# Patient Record
Sex: Female | Born: 1980 | Race: Black or African American | Hispanic: No | Marital: Married | Smoking: Former smoker
Health system: Southern US, Community
[De-identification: ages and names within clinical notes are randomized; demographics above are authoritative.]

## PROBLEM LIST (undated history)

## (undated) ENCOUNTER — Inpatient Hospital Stay (HOSPITAL_COMMUNITY): Payer: Self-pay

## (undated) DIAGNOSIS — I10 Essential (primary) hypertension: Secondary | ICD-10-CM

## (undated) DIAGNOSIS — R519 Headache, unspecified: Secondary | ICD-10-CM

## (undated) DIAGNOSIS — O009 Unspecified ectopic pregnancy without intrauterine pregnancy: Secondary | ICD-10-CM

## (undated) HISTORY — PX: TUBAL LIGATION: SHX77

---

## 2008-12-27 ENCOUNTER — Inpatient Hospital Stay (HOSPITAL_COMMUNITY): Admission: AD | Admit: 2008-12-27 | Discharge: 2008-12-27 | Payer: Self-pay | Admitting: Obstetrics & Gynecology

## 2009-01-25 ENCOUNTER — Inpatient Hospital Stay (HOSPITAL_COMMUNITY): Admission: AD | Admit: 2009-01-25 | Discharge: 2009-01-25 | Payer: Self-pay | Admitting: Obstetrics

## 2009-01-25 ENCOUNTER — Ambulatory Visit: Payer: Self-pay | Admitting: Advanced Practice Midwife

## 2009-02-25 ENCOUNTER — Inpatient Hospital Stay (HOSPITAL_COMMUNITY): Admission: AD | Admit: 2009-02-25 | Discharge: 2009-02-27 | Payer: Self-pay | Admitting: Obstetrics & Gynecology

## 2009-04-07 ENCOUNTER — Ambulatory Visit (HOSPITAL_COMMUNITY): Admission: RE | Admit: 2009-04-07 | Discharge: 2009-04-07 | Payer: Self-pay | Admitting: Obstetrics & Gynecology

## 2009-12-03 ENCOUNTER — Emergency Department (HOSPITAL_COMMUNITY): Admission: EM | Admit: 2009-12-03 | Discharge: 2009-12-03 | Payer: Self-pay | Admitting: Family Medicine

## 2010-11-14 LAB — POCT I-STAT, CHEM 8
BUN: 9 mg/dL (ref 6–23)
Creatinine, Ser: 0.8 mg/dL (ref 0.4–1.2)
Potassium: 3.9 mEq/L (ref 3.5–5.1)
Sodium: 137 mEq/L (ref 135–145)

## 2010-11-14 LAB — POCT URINALYSIS DIP (DEVICE)
Bilirubin Urine: NEGATIVE
Glucose, UA: NEGATIVE mg/dL
Ketones, ur: NEGATIVE mg/dL
Specific Gravity, Urine: 1.015 (ref 1.005–1.030)

## 2010-12-01 LAB — CBC
Hemoglobin: 13.5 g/dL (ref 12.0–15.0)
MCHC: 32.3 g/dL (ref 30.0–36.0)
MCV: 83.5 fL (ref 78.0–100.0)
RBC: 5 MIL/uL (ref 3.87–5.11)

## 2010-12-02 LAB — CBC
HCT: 33.7 % — ABNORMAL LOW (ref 36.0–46.0)
HCT: 36.3 % (ref 36.0–46.0)
Hemoglobin: 12.1 g/dL (ref 12.0–15.0)
MCV: 84.3 fL (ref 78.0–100.0)
Platelets: 221 10*3/uL (ref 150–400)
Platelets: 228 10*3/uL (ref 150–400)
RDW: 15.7 % — ABNORMAL HIGH (ref 11.5–15.5)
WBC: 6.9 10*3/uL (ref 4.0–10.5)
WBC: 8.9 10*3/uL (ref 4.0–10.5)

## 2010-12-02 LAB — RH IMMUNE GLOB WKUP(>/=20WKS)(NOT WOMEN'S HOSP)

## 2010-12-04 LAB — RH IMMUNE GLOBULIN WORKUP (NOT WOMEN'S HOSP)
ABO/RH(D): O NEG
Antibody Screen: NEGATIVE

## 2011-01-08 NOTE — Op Note (Signed)
Cindy Wang, Cindy Wang              ACCOUNT NO.:  [id number]   MEDICAL RECORD NO.:  [id number]          PATIENT TYPE:  AMB   LOCATION:  SDC                           FACILITY:  [facility]   PHYSICIAN:  Roseanna Rainbow, M.D.DATE OF BIRTH:  09-25-1980   DATE OF PROCEDURE:  04/07/2009  DATE OF DISCHARGE:  04/07/2009                               OPERATIVE REPORT   PREOPERATIVE DIAGNOSIS:  Multiparity, desires sterilization   POSTOPERATIVE DIAGNOSIS:  Multiparity, desires sterilization.   PROCEDURE:  Laparoscopic bilateral tubal ligation with fulguration.   SURGEON:  Roseanna Rainbow, MD.   ANESTHESIA:  General endotracheal.   ESTIMATED BLOOD LOSS:  Minimal.   COMPLICATIONS:  None.   DESCRIPTION OF PROCEDURE:  The patient was taken to the operating room  with an IV running.  A general anesthetic was administered.  She was  then placed in the dorsal lithotomy position and prepped and draped in  the usual sterile fashion.  A bivalve speculum was then placed.  The  anterior lip of the cervix was then grasped with a single-tooth  tenaculum.  A Hulka manipulator was then advanced into the uterus and  secured to the anterior lip of the cervix as a means to manipulate the  uterus.  The tenaculum and speculum was then removed.  After a time-out  had been completed an infraumbilical skin incision was then made with  the scalpel.  Using the Optiview a 10 mm trocar and sleeve were advanced  into the abdomen under direct visualization.  The abdomen was then  insufflated with CO2 gas.  A survey of the pelvic anatomy was  essentially normal with the uterine fundus appeared somewhat globular.  The right fallopian tube was then identified.  A 2-cm segment of the mid  isthmic portion of the tube was then cauterized.  With each application  of the bipolar device, the ohmmeter was noted to go to zero.  The left  fallopian tube was then manipulated in a similar fashion.  All the  instruments were  then removed from the abdomen.  The fascial incision  was reapproximated with an interrupted suture of 0 Vicryl.  The skin was  closed with Dermabond.  The Hulka manipulator was removed with minimal  bleeding noted from the cervix.  At the close of the procedure, the  instrument and pack counts were said to be correct x2.  The patient was  taken to the PACU awake and in stable condition.      Roseanna Rainbow, M.D.  Electronically Signed     LAJ/MEDQ  D:  04/07/2009  T:  04/08/2009  Job:  [id number]

## 2013-02-12 ENCOUNTER — Encounter (HOSPITAL_COMMUNITY): Payer: Self-pay | Admitting: Emergency Medicine

## 2013-02-12 ENCOUNTER — Emergency Department (HOSPITAL_COMMUNITY)
Admission: EM | Admit: 2013-02-12 | Discharge: 2013-02-12 | Disposition: A | Discharge status: Home / Self Care | Payer: Medicaid Other | Source: Home / Self Care | Attending: Emergency Medicine | Admitting: Emergency Medicine

## 2013-02-12 DIAGNOSIS — J040 Acute laryngitis: Secondary | ICD-10-CM

## 2013-02-12 DIAGNOSIS — J069 Acute upper respiratory infection, unspecified: Secondary | ICD-10-CM

## 2013-02-12 MED ORDER — PREDNISONE 20 MG PO TABS
ORAL_TABLET | ORAL | Status: AC
  Filled 2013-02-12: qty 3

## 2013-02-12 MED ORDER — PREDNISONE 10 MG PO TABS: 50.0000 mg | ORAL_TABLET | Freq: Every day | ORAL | Status: DC

## 2013-02-12 MED ORDER — PREDNISONE 20 MG PO TABS
50.0000 mg | ORAL_TABLET | Freq: Once | ORAL | Status: DC
  Administered 2013-02-12: 50 mg via ORAL

## 2013-02-12 MED ORDER — PREDNISONE 20 MG PO TABS
60.0000 mg | ORAL_TABLET | Freq: Once | ORAL | Status: AC
  Administered 2013-02-12: 60 mg via ORAL

## 2013-02-12 MED ORDER — SALINE NASAL SPRAY 0.65 % NA SOLN: 1.0000 | NASAL | Status: DC | PRN

## 2013-02-12 NOTE — ED Notes (Signed)
Pt c/o cold sxs onset Monday... sxs include: facial pressure, bilateral ear pain, dry cough, loss of voice... Denies: f/v/n/d, SOB, wheezing... Taking OTC cold meds w/no relief... She is alert and oriented w/no signs of acute distress.

## 2013-02-12 NOTE — ED Provider Notes (Signed)
History     CSN: [id number]  Arrival date & time 02/12/13  1733   First MD Initiated Contact with Patient 02/12/13 1811      Chief Complaint  Patient presents with  . URI    (Consider location/radiation/quality/duration/timing/severity/associated sxs/prior treatment) Patient is a 32 y.o. female presenting with URI. The history is provided by the patient.  URI Presenting symptoms: congestion, cough, ear pain, facial pain, fever and sore throat   Presenting symptoms: no rhinorrhea   Severity:  Moderate Onset quality:  Gradual Duration:  4 days Timing:  Constant Progression:  Worsening Chronicity:  New Relieved by:  Nothing Worsened by:  Nothing tried Ineffective treatments: otc advil cold and sinus was mildly transiently effective. Associated symptoms: sinus pain   Associated symptoms: no wheezing     History reviewed. No pertinent past medical history.  Past Surgical History  Procedure Laterality Date  . Tubal ligation Bilateral     History reviewed. No pertinent family history.  History  Substance Use Topics  . Smoking status: Never Smoker   . Smokeless tobacco: Not on file  . Alcohol Use: No    OB History   Grav Para Term Preterm Abortions TAB SAB Ect Mult Living                  Review of Systems  Constitutional: Positive for fever and chills.  HENT: Positive for ear pain, congestion, sore throat, voice change, postnasal drip and sinus pressure. Negative for rhinorrhea.   Respiratory: Positive for cough. Negative for shortness of breath and wheezing.     Allergies  Review of patient's allergies indicates no known allergies.  Home Medications   Current Outpatient Rx  Name  Route  Sig  Dispense  Refill  . predniSONE (DELTASONE) 10 MG tablet   Oral   Take 5 tablets (50 mg total) by mouth daily.   20 tablet   0   . sodium chloride (OCEAN NASAL SPRAY) 0.65 % nasal spray   Nasal   Place 1 spray into the nose as needed for congestion.   30 mL  12     BP 141/97  Pulse 99  Temp(Src) 100 F (37.8 C) (Oral)  Resp 18  SpO2 99%  LMP 02/05/2013  Physical Exam  Constitutional: She appears well-developed and well-nourished. She appears ill. No distress.  HENT:  Right Ear: Tympanic membrane, external ear and ear canal normal.  Left Ear: Tympanic membrane, external ear and ear canal normal.  Nose: Mucosal edema present. Right sinus exhibits no maxillary sinus tenderness and no frontal sinus tenderness. Left sinus exhibits maxillary sinus tenderness and frontal sinus tenderness.  Mouth/Throat: Mucous membranes are normal. Posterior oropharyngeal edema present. No oropharyngeal exudate, posterior oropharyngeal erythema or tonsillar abscesses.  Hoarse voice  Cardiovascular: Normal rate and regular rhythm.   Pulmonary/Chest: Effort normal and breath sounds normal.  Lymphadenopathy:       Head (right side): No submental, no submandibular and no tonsillar adenopathy present.       Head (left side): No submental, no submandibular and no tonsillar adenopathy present.    She has no cervical adenopathy.    ED Course  Procedures (including critical care time)  Labs Reviewed - No data to display No results found.   1. URI (upper respiratory infection)   2. Laryngitis       MDM  Pt to continue using otc cold medicine, also recommended nasal saline spray, and pt given prednisone 60mg  here at [facility].  Rx  prednisone 50mg  daily for 4 more days to help with sore throat/ swelling.         Cathlyn Parsons, NP 02/12/13 2026

## 2013-02-12 NOTE — ED Provider Notes (Signed)
Medical screening examination/treatment/procedure(s) were performed by non-physician practitioner and as supervising physician I was immediately available for consultation/collaboration.  Leslee Home, M.D.  Reuben Likes, MD 02/12/13 2102

## 2013-02-17 ENCOUNTER — Encounter (HOSPITAL_COMMUNITY): Payer: Self-pay | Admitting: Emergency Medicine

## 2013-02-17 ENCOUNTER — Emergency Department (HOSPITAL_COMMUNITY)
Admission: EM | Admit: 2013-02-17 | Discharge: 2013-02-17 | Disposition: A | Discharge status: Home / Self Care | Payer: Medicaid Other | Source: Home / Self Care

## 2013-02-17 DIAGNOSIS — J309 Allergic rhinitis, unspecified: Secondary | ICD-10-CM

## 2013-02-17 DIAGNOSIS — J329 Chronic sinusitis, unspecified: Secondary | ICD-10-CM

## 2013-02-17 MED ORDER — FEXOFENADINE HCL 180 MG PO TABS: 180.0000 mg | ORAL_TABLET | Freq: Every day | ORAL | Status: DC

## 2013-02-17 MED ORDER — CEFUROXIME AXETIL 250 MG PO TABS: 250.0000 mg | ORAL_TABLET | Freq: Two times a day (BID) | ORAL | Status: DC

## 2013-02-17 MED ORDER — HYDROCODONE-ACETAMINOPHEN 5-325 MG PO TABS
1.0000 | ORAL_TABLET | ORAL | Status: DC | PRN
Start: 2013-02-17 — End: 2015-06-12

## 2013-02-17 NOTE — ED Notes (Signed)
Pt was seen here this past Friday for URI....comes in today complaining of pain in L ear w/ facial pain and stiff neck...has sensitivity to light and sound..yellow/green stuff coming out of nose...has some nausea but denies emesis.Marland Kitchenshe is drinking water and gatorade...denies any problems with urination, no chest or back pain.Marland KitchenMarland KitchenTJK student

## 2013-02-17 NOTE — ED Notes (Signed)
ZOX:[id number]<AV> Expected date:02/17/13<BR> Expected time: 5:45 PM<BR> Means of arrival:<BR> Comments:<BR> delousing

## 2013-02-17 NOTE — ED Provider Notes (Signed)
Medical screening examination/treatment/procedure(s) were performed by non-physician practitioner and as supervising physician I was immediately available for consultation/collaboration.   MORENO-COLL,Winter Jocelyn; MD  Markell Schrier Moreno-Coll, MD 02/17/13 1953 

## 2013-02-17 NOTE — ED Provider Notes (Signed)
History    CSN: [id number] Arrival date & time 02/17/13  1645  First MD Initiated Contact with Patient 02/17/13 1758     No chief complaint on file.  (Consider location/radiation/quality/duration/timing/severity/associated sxs/prior Treatment) HPI Comments: 32 year old female is complaining of pain in the left face and ear and left lateral neck. She was seen in the urgent care Department 5 days ago for similar symptoms which also included drainage facial pressure fever, chest pain, and nausea without vomiting. She was treated with prednisone, fluticasone and several other symptomatic medications. She states anyway she is feeling better but her primary concern is that of persistent facial pain and nasal congestion with left earache. She is completed her course of prednisone.  No past medical history on file. Past Surgical History  Procedure Laterality Date  . Tubal ligation Bilateral    No family history on file. History  Substance Use Topics  . Smoking status: Never Smoker   . Smokeless tobacco: Not on file  . Alcohol Use: No   OB History   Grav Para Term Preterm Abortions TAB SAB Ect Mult Living                 Review of Systems  Constitutional: Negative for fever, diaphoresis and activity change.  HENT: Positive for ear pain, congestion, rhinorrhea, neck pain and postnasal drip. Negative for sore throat and ear discharge.   Eyes: Negative.   Respiratory: Positive for cough. Negative for shortness of breath and wheezing.   Cardiovascular: Negative for chest pain, palpitations and leg swelling.  Gastrointestinal: Negative.   Genitourinary: Negative.   Musculoskeletal:       Pain in the lateral neck over the trapezius muscle.    Allergies  Review of patient's allergies indicates no known allergies.  Home Medications   Current Outpatient Rx  Name  Route  Sig  Dispense  Refill  . predniSONE (DELTASONE) 10 MG tablet   Oral   Take 5 tablets (50 mg total) by mouth  daily.   20 tablet   0   . sodium chloride (OCEAN NASAL SPRAY) 0.65 % nasal spray   Nasal   Place 1 spray into the nose as needed for congestion.   30 mL   12    LMP 02/05/2013 Physical Exam  Nursing note and vitals reviewed. Constitutional: She is oriented to person, place, and time. She appears well-developed and well-nourished. No distress.  HENT:  Bilateral TMs are normal. Oropharynx with minor erythema but no exudates.  Eyes: Conjunctivae and EOM are normal. Pupils are equal, round, and reactive to light.  Neck: Normal range of motion. Neck supple.  Tenderness over the left lateral trapezius as it inserts on the neck.  Cardiovascular: Normal rate and regular rhythm.   Pulmonary/Chest: Effort normal and breath sounds normal. No respiratory distress.  Musculoskeletal: Normal range of motion. She exhibits no edema.  Lymphadenopathy:    She has no cervical adenopathy.  Neurological: She is alert and oriented to person, place, and time.  Skin: Skin is warm and dry. No rash noted.  Psychiatric: She has a normal mood and affect.    ED Course  Procedures (including critical care time) Labs Reviewed - No data to display No results found. No diagnosis found.  MDM  Ceftin 250 mg twice a day for 8 days Norco 5 mg every 4 hours when necessary pain #15 Continue your other treatment such as the fluticasone nasal spray, and Netty pott, Allegra one 80 mg daily, phenylephrine and  guaifenesin. Drink plenty of fluids stay well hydrated Followup your primary care doctor as needed. Worse the symptoms or problems may return.  Hayden Rasmussen, NP 02/17/13 [phone]

## 2013-05-10 ENCOUNTER — Encounter: Payer: Self-pay | Admitting: *Deleted

## 2013-06-16 ENCOUNTER — Encounter: Payer: Medicaid Other | Admitting: Obstetrics & Gynecology

## 2014-09-26 ENCOUNTER — Encounter (HOSPITAL_COMMUNITY): Payer: Self-pay

## 2014-09-26 ENCOUNTER — Emergency Department (INDEPENDENT_AMBULATORY_CARE_PROVIDER_SITE_OTHER)
Admission: EM | Admit: 2014-09-26 | Discharge: 2014-09-26 | Disposition: A | Discharge status: Home / Self Care | Payer: Self-pay | Source: Home / Self Care | Attending: Family Medicine | Admitting: Family Medicine

## 2014-09-26 DIAGNOSIS — K529 Noninfective gastroenteritis and colitis, unspecified: Secondary | ICD-10-CM

## 2014-09-26 LAB — POCT URINALYSIS DIP (DEVICE)
BILIRUBIN URINE: NEGATIVE
Glucose, UA: NEGATIVE mg/dL
KETONES UR: NEGATIVE mg/dL
Nitrite: NEGATIVE
PROTEIN: 100 mg/dL — AB
Specific Gravity, Urine: 1.02 (ref 1.005–1.030)
UROBILINOGEN UA: 0.2 mg/dL (ref 0.0–1.0)
pH: 6 (ref 5.0–8.0)

## 2014-09-26 LAB — POCT PREGNANCY, URINE: PREG TEST UR: NEGATIVE

## 2014-09-26 MED ORDER — ONDANSETRON 4 MG PO TBDP
ORAL_TABLET | ORAL | Status: AC
  Filled 2014-09-26: qty 1

## 2014-09-26 MED ORDER — ONDANSETRON 4 MG PO TBDP: 4.0000 mg | ORAL_TABLET | Freq: Once | ORAL | Status: DC

## 2014-09-26 MED ORDER — ONDANSETRON 4 MG PO TBDP
4.0000 mg | ORAL_TABLET | Freq: Once | ORAL | Status: AC
  Administered 2014-09-26: 4 mg via ORAL

## 2014-09-26 MED ORDER — ONDANSETRON HCL 4 MG PO TABS: 4.0000 mg | ORAL_TABLET | Freq: Four times a day (QID) | ORAL | Status: DC

## 2014-09-26 NOTE — ED Notes (Signed)
Patient complains of lower back pain that started last night Patient states she was vomiting last evening and running a temperature Pain is more so on her right side Decreased appetite

## 2014-09-26 NOTE — ED Provider Notes (Signed)
CSN: [id number]     Arrival date & time 09/26/14  1817 History   First MD Initiated Contact with Patient 09/26/14 1932     Chief Complaint  Patient presents with  . Back Pain   (Consider location/radiation/quality/duration/timing/severity/associated sxs/prior Treatment) Patient is a 34 y.o. female presenting with vomiting. The history is provided by the patient.  Emesis Severity:  Moderate Duration:  1 day Quality:  Stomach contents and bilious material Progression:  Resolved Chronicity:  New (onset yest 7pm , resolved this am, assoc diarrhea.) Recent urination:  Normal Associated symptoms: chills, diarrhea and fever   Associated symptoms: no abdominal pain     History reviewed. No pertinent past medical history. Past Surgical History  Procedure Laterality Date  . Tubal ligation Bilateral    No family history on file. History  Substance Use Topics  . Smoking status: Never Smoker   . Smokeless tobacco: Not on file  . Alcohol Use: No   OB History    No data available     Review of Systems  Constitutional: Positive for fever, chills and appetite change.  HENT: Negative.   Respiratory: Negative.   Gastrointestinal: Positive for nausea, vomiting and diarrhea. Negative for abdominal pain.  Genitourinary: Negative.   Musculoskeletal: Positive for back pain.    Allergies  Review of patient's allergies indicates no known allergies.  Home Medications   Prior to Admission medications   Medication Sig Start Date End Date Taking? Authorizing Provider  cefUROXime (CEFTIN) 250 MG tablet Take 1 tablet (250 mg total) by mouth 2 (two) times daily. 02/17/13   Hayden Rasmussenavid Mabe, NP  fexofenadine (ALLEGRA) 180 MG tablet Take 1 tablet (180 mg total) by mouth daily. 02/17/13   Hayden Rasmussenavid Mabe, NP  HYDROcodone-acetaminophen (NORCO/VICODIN) 5-325 MG per tablet Take 1 tablet by mouth every 4 (four) hours as needed for pain. 02/17/13   Hayden Rasmussenavid Mabe, NP  ondansetron (ZOFRAN) 4 MG tablet Take 1 tablet (4 mg  total) by mouth every 6 (six) hours. Prn n/v 09/26/14   Linna HoffJames D Candy Ziegler, MD  predniSONE (DELTASONE) 10 MG tablet Take 5 tablets (50 mg total) by mouth daily. 02/12/13   Cathlyn ParsonsAngela M Kabbe, NP  sodium chloride (OCEAN NASAL SPRAY) 0.65 % nasal spray Place 1 spray into the nose as needed for congestion. 02/12/13   Cathlyn ParsonsAngela M Kabbe, NP   BP 146/98 mmHg  Pulse 80  Temp(Src) 99.1 F (37.3 C) (Oral)  Resp 16  SpO2 98%  LMP 09/19/2014 Physical Exam  Constitutional: She appears well-developed and well-nourished. No distress.  HENT:  Head: Normocephalic.  Mouth/Throat: Oropharynx is clear and moist.  Neck: Normal range of motion. Neck supple.  Abdominal: Soft. Normal appearance and bowel sounds are normal. She exhibits no distension and no mass. There is no hepatosplenomegaly. There is tenderness. There is CVA tenderness. There is no rigidity, no rebound and no guarding.  Lymphadenopathy:    She has no cervical adenopathy.  Skin: Skin is warm and dry.  Nursing note and vitals reviewed.   ED Course  Procedures (including critical care time) Labs Review Labs Reviewed  POCT URINALYSIS DIP (DEVICE) - Abnormal; Notable for the following:    Hgb urine dipstick MODERATE (*)    Protein, ur 100 (*)    Leukocytes, UA TRACE (*)    All other components within normal limits  POCT PREGNANCY, URINE   U/a bld mod upreg neg Imaging Review No results found.   MDM   1. Gastroenteritis, acute  Linna Hoff, MD 09/26/14 [phone]

## 2014-09-26 NOTE — Discharge Instructions (Signed)
Clear liquid , bland diet tonight as tolerated, advance on tues as improved, use medicine as needed, imodium for diarrhea, return or see your doctor if any problems. °

## 2015-06-12 ENCOUNTER — Ambulatory Visit (INDEPENDENT_AMBULATORY_CARE_PROVIDER_SITE_OTHER): Payer: PRIVATE HEALTH INSURANCE | Admitting: Obstetrics

## 2015-06-12 ENCOUNTER — Encounter: Payer: Self-pay | Admitting: Obstetrics

## 2015-06-12 VITALS — BP 141/100 | HR 81 | Temp 98.5°F | Ht 61.0 in | Wt 171.0 lb

## 2015-06-12 DIAGNOSIS — Z3201 Encounter for pregnancy test, result positive: Secondary | ICD-10-CM

## 2015-06-12 DIAGNOSIS — O3680X0 Pregnancy with inconclusive fetal viability, not applicable or unspecified: Secondary | ICD-10-CM

## 2015-06-12 DIAGNOSIS — N926 Irregular menstruation, unspecified: Secondary | ICD-10-CM | POA: Diagnosis not present

## 2015-06-12 LAB — POCT URINE PREGNANCY: PREG TEST UR: POSITIVE — AB

## 2015-06-13 ENCOUNTER — Encounter: Payer: Self-pay | Admitting: Obstetrics

## 2015-06-13 LAB — HCG, QUANTITATIVE, PREGNANCY: hCG, Beta Chain, Quant, S: 1878.9 m[IU]/mL

## 2015-06-13 NOTE — Progress Notes (Signed)
Patient ID: Cindy Wang, female   DOB: [birth year], 34 y.o.   MRN: [id number]  Chief Complaint  Patient presents with  . Confirmation of Pregnancy    Patient has a BTL in 2010    HPI Cindy Wang is a 34 y.o. female.  Had Laparoscopic tubal in 2010.  Missed period this past month, then had positive UPT.  HPI  History reviewed. No pertinent past medical history.  Past Surgical History  Procedure Laterality Date  . Tubal ligation Bilateral     History reviewed. No pertinent family history.  Social History Social History  Substance Use Topics  . Smoking status: Former Games developermoker  . Smokeless tobacco: None  . Alcohol Use: No    No Known Allergies  No current outpatient prescriptions on file.   No current facility-administered medications for this visit.    Review of Systems Review of Systems Constitutional: negative for fatigue and weight loss Respiratory: negative for cough and wheezing Cardiovascular: negative for chest pain, fatigue and palpitations Gastrointestinal: negative for abdominal pain and change in bowel habits Genitourinary:negative Integument/breast: negative for nipple discharge Musculoskeletal:negative for myalgias Neurological: negative for gait problems and tremors Behavioral/Psych: negative for abusive relationship, depression Endocrine: negative for temperature intolerance     Blood pressure 141/100, pulse 81, temperature 98.5 F (36.9 C), height 5\' 1"  (1.549 m), weight 171 lb (77.565 kg), last menstrual period 05/03/2015.  Physical Exam Physical Exam: Deferred  100% of 10 min visit spent on counseling and coordination of care.   Data Reviewed UPT  Assessment     Early pregnancy S/P tubal steriilization     Plan    Quantitative beta Hcg drawn, repeat in 2 days. Ectopic precautions given F/U in 2 days.  Plan ultrasound when Quant is in critical range.  Orders Placed This Encounter  Procedures  . hCG, quantitative, pregnancy   . B-HCG Quant  . POCT urine pregnancy   No orders of the defined types were placed in this encounter.

## 2015-06-14 ENCOUNTER — Inpatient Hospital Stay (HOSPITAL_COMMUNITY): Payer: PRIVATE HEALTH INSURANCE

## 2015-06-14 ENCOUNTER — Other Ambulatory Visit: Payer: PRIVATE HEALTH INSURANCE | Admitting: Obstetrics

## 2015-06-14 ENCOUNTER — Other Ambulatory Visit: Payer: Self-pay | Admitting: Obstetrics

## 2015-06-14 ENCOUNTER — Other Ambulatory Visit: Payer: PRIVATE HEALTH INSURANCE

## 2015-06-14 ENCOUNTER — Encounter (HOSPITAL_COMMUNITY): Payer: Self-pay | Admitting: *Deleted

## 2015-06-14 ENCOUNTER — Inpatient Hospital Stay (HOSPITAL_COMMUNITY)
Admission: AD | Admit: 2015-06-14 | Discharge: 2015-06-14 | Disposition: A | Discharge status: Home / Self Care | Payer: PRIVATE HEALTH INSURANCE | Source: Ambulatory Visit | Attending: Obstetrics | Admitting: Obstetrics

## 2015-06-14 DIAGNOSIS — Z3490 Encounter for supervision of normal pregnancy, unspecified, unspecified trimester: Secondary | ICD-10-CM

## 2015-06-14 DIAGNOSIS — O001 Tubal pregnancy without intrauterine pregnancy: Secondary | ICD-10-CM | POA: Diagnosis not present

## 2015-06-14 DIAGNOSIS — Z87891 Personal history of nicotine dependence: Secondary | ICD-10-CM | POA: Diagnosis not present

## 2015-06-14 DIAGNOSIS — Z9851 Tubal ligation status: Secondary | ICD-10-CM | POA: Diagnosis not present

## 2015-06-14 DIAGNOSIS — O009 Unspecified ectopic pregnancy without intrauterine pregnancy: Secondary | ICD-10-CM

## 2015-06-14 DIAGNOSIS — R109 Unspecified abdominal pain: Secondary | ICD-10-CM

## 2015-06-14 DIAGNOSIS — R1032 Left lower quadrant pain: Secondary | ICD-10-CM | POA: Insufficient documentation

## 2015-06-14 DIAGNOSIS — O26899 Other specified pregnancy related conditions, unspecified trimester: Secondary | ICD-10-CM

## 2015-06-14 LAB — CBC WITH DIFFERENTIAL/PLATELET
BASOS ABS: 0 10*3/uL (ref 0.0–0.1)
BASOS PCT: 0 %
Eosinophils Absolute: 0.1 10*3/uL (ref 0.0–0.7)
Eosinophils Relative: 2 %
HEMATOCRIT: 37.3 % (ref 36.0–46.0)
Hemoglobin: 12 g/dL (ref 12.0–15.0)
Lymphocytes Relative: 39 %
Lymphs Abs: 3.5 10*3/uL (ref 0.7–4.0)
MCH: 26.7 pg (ref 26.0–34.0)
MCHC: 32.2 g/dL (ref 30.0–36.0)
MCV: 83.1 fL (ref 78.0–100.0)
MONO ABS: 0.9 10*3/uL (ref 0.1–1.0)
Monocytes Relative: 10 %
NEUTROS ABS: 4.5 10*3/uL (ref 1.7–7.7)
Neutrophils Relative %: 49 %
PLATELETS: 263 10*3/uL (ref 150–400)
RBC: 4.49 MIL/uL (ref 3.87–5.11)
RDW: 14.2 % (ref 11.5–15.5)
WBC: 9.1 10*3/uL (ref 4.0–10.5)

## 2015-06-14 LAB — CREATININE, SERUM
CREATININE: 0.75 mg/dL (ref 0.44–1.00)
GFR calc Af Amer: >60 mL/min (ref >60)
GFR calc non Af Amer: >60 mL/min (ref >60)

## 2015-06-14 LAB — HCG, QUANTITATIVE, PREGNANCY: HCG, BETA CHAIN, QUANT, S: 1791.6 m[IU]/mL — AB

## 2015-06-14 LAB — BUN: BUN: 9 mg/dL (ref 6–20)

## 2015-06-14 LAB — AST: AST: 13 U/L — ABNORMAL LOW (ref 15–41)

## 2015-06-14 MED ORDER — METHOTREXATE INJECTION FOR WOMEN'S HOSPITAL
50.0000 mg/m2 | Freq: Once | INTRAMUSCULAR | Status: AC
  Administered 2015-06-14: 90 mg via INTRAMUSCULAR
  Filled 2015-06-14: qty 1.8

## 2015-06-14 MED ORDER — RHO D IMMUNE GLOBULIN 1500 UNIT/2ML IJ SOSY
300.0000 ug | PREFILLED_SYRINGE | Freq: Once | INTRAMUSCULAR | Status: AC
  Administered 2015-06-14: 300 ug via INTRAMUSCULAR
  Filled 2015-06-14: qty 2

## 2015-06-14 NOTE — MAU Note (Signed)
Pt called, not in lobby 

## 2015-06-14 NOTE — MAU Provider Note (Signed)
History     CSN: [id number]  Arrival date and time: 06/14/15 1503   First Provider Initiated Contact with Patient 06/14/15 1626      Chief Complaint  Patient presents with  . Abdominal Pain   HPI This is a 34 y.o. female who presents from Dr Verdell Carmine office for ultrasound to rule out ectopic pregnancy.  HCG levels dropped. She had a tubal ligation in 2012 and they suspect this is an ectopic pregnancy. Pain has been on LLQ area.   RN Note: Pt seen in MD office today, BHCG levels are dropping. Pt had BTL in 2010, ? Ectopic pregnancy. LLQ pain for the last 3-4 days, no bleeding  OB History    Gravida Para Term Preterm AB TAB SAB Ectopic Multiple Living   History reviewed. No pertinent past medical history.  Past Surgical History  Procedure Laterality Date  . Tubal ligation Bilateral     History reviewed. No pertinent family history.  Social History  Substance Use Topics  . Smoking status: Former Games developer  . Smokeless tobacco: None  . Alcohol Use: No    Allergies: No Known Allergies  No prescriptions prior to admission   Medical, Surgical, Family and Social histories reviewed and are listed above.  Medications and allergies reviewed.  Review of Systems  Constitutional: Negative for fever, chills and malaise/fatigue.  Gastrointestinal: Positive for abdominal pain. Negative for nausea, vomiting, diarrhea and constipation.  Genitourinary: Negative for dysuria.  Musculoskeletal: Negative for back pain.  Neurological: Negative for dizziness and weakness.   Physical Exam   Blood pressure 136/99, pulse 85, temperature 98.1 F (36.7 C), temperature source Oral, resp. rate 18, height  (1.549 m), weight 173 lb 6.4 oz (78.654 kg), last menstrual period 05/03/2015.  Physical Exam  Constitutional: She is oriented to person, place, and time. She appears well-developed and well-nourished. No distress.  HENT:  Head: Normocephalic.  Cardiovascular:  Normal rate, regular rhythm and normal heart sounds.   Respiratory: Effort normal and breath sounds normal. No respiratory distress.  GI: Soft. She exhibits no distension. There is no tenderness. There is no rebound and no guarding.  Genitourinary:  Done in office, deferred  Musculoskeletal: Normal range of motion.  Neurological: She is alert and oriented to person, place, and time.  Skin: Skin is warm and dry.  Psychiatric: She has a normal mood and affect.    MAU Course  Procedures  MDM US Ob Comp Less 14 Wks  06/14/2015  CLINICAL DATA:  Left lower quadrant pain for 4 days. Inappropriately rising quantitative beta HCG. EXAM: OBSTETRIC <14 WK [place] AND TRANSVAGINAL OB US TECHNIQUE: Both transabdominal and transvaginal ultrasound examinations were performed for complete evaluation of the gestation as well as the maternal uterus, adnexal regions, and pelvic cul-de-sac. Transvaginal technique was performed to assess early pregnancy. COMPARISON:  None. FINDINGS: Intrauterine gestational sac: None Yolk sac:  None Embryo:  None Cardiac Activity: Not applicable Maternal uterus/adnexae: Subchorionic hemorrhage: Not applicable Right ovary: Normal Left ovary: Normal Other :Within the right adnexa there is a complex mass structure measuring 1.3 x 1.2 x 1.1 cm. This has a central fluid component which appears to have a small yolk sac. Suspicious for ectopic. Free fluid:  None. IMPRESSION: 1. No evidence for intrauterine gestation. 2. Complex cystic structure in the right adnexa is suspicious for ectopic pregnancy. Critical Value/emergent results were called by telephone at the time of interpretation  on 06/14/2015 at 4:16 pm to Dr. Wynelle BourgeoisMARIE WILLIAMS , who verbally acknowledged these results. Electronically Signed   By: Signa Kellaylor  Stroud M.D.   On: 06/14/2015 16:16   [place] Ob Transvaginal  06/14/2015  CLINICAL DATA:  Left lower quadrant pain for 4 days. Inappropriately rising quantitative beta HCG. EXAM: OBSTETRIC <14  WK US AND TRANSVAGINAL OB US TECHNIQUE: Both transabdominal and transvaginal ultrasound examinations were performed for complete evaluation of the gestation as well as the maternal uterus, adnexal regions, and pelvic cul-de-sac. Transvaginal technique was performed to assess early pregnancy. COMPARISON:  None. FINDINGS: Intrauterine gestational sac: None Yolk sac:  None Embryo:  None Cardiac Activity: Not applicable Maternal uterus/adnexae: Subchorionic hemorrhage: Not applicable Right ovary: Normal Left ovary: Normal Other :Within the right adnexa there is a complex mass structure measuring 1.3 x 1.2 x 1.1 cm. This has a central fluid component which appears to have a small yolk sac. Suspicious for ectopic. Free fluid:  None. IMPRESSION: 1. No evidence for intrauterine gestation. 2. Complex cystic structure in the right adnexa is suspicious for ectopic pregnancy. Critical Value/emergent results were called by telephone at the time of interpretation on 06/14/2015 at 4:16 pm to Dr. Wynelle BourgeoisMARIE WILLIAMS , who verbally acknowledged these results. Electronically Signed   By: Signa Kellaylor  Stroud M.D.   On: 06/14/2015 16:16   Results for orders placed or performed during the hospital encounter of 06/14/15 (from the past 24 hour(s))  CBC WITH DIFFERENTIAL     Status: None   Collection Time: 06/14/15  4:35 PM  Result Value Ref Range   WBC 9.1 4.0 - 10.5 K/uL   RBC 4.49 3.87 - 5.11 MIL/uL   Hemoglobin 12.0 12.0 - 15.0 g/dL   HCT 29.537.3 62.136.0 - 30.846.0 %   MCV 83.1 78.0 - 100.0 fL   MCH 26.7 26.0 - 34.0 pg   MCHC 32.2 30.0 - 36.0 g/dL   RDW 65.714.2 84.611.5 - 96.215.5 %   Platelets 263 150 - 400 K/uL   Neutrophils Relative % 49 %   Neutro Abs 4.5 1.7 - 7.7 K/uL   Lymphocytes Relative 39 %   Lymphs Abs 3.5 0.7 - 4.0 K/uL   Monocytes Relative 10 %   Monocytes Absolute 0.9 0.1 - 1.0 K/uL   Eosinophils Relative 2 %   Eosinophils Absolute 0.1 0.0 - 0.7 K/uL   Basophils Relative 0 %   Basophils Absolute 0.0 0.0 - 0.1 K/uL  AST      Status: Abnormal   Collection Time: 06/14/15  4:35 PM  Result Value Ref Range   AST 13 (L) 15 - 41 U/L  BUN     Status: None   Collection Time: 06/14/15  4:35 PM  Result Value Ref Range   BUN 9 6 - 20 mg/dL  Creatinine, serum     Status: None   Collection Time: 06/14/15  4:35 PM  Result Value Ref Range   Creatinine, Ser 0.75 0.44 - 1.00 mg/dL   GFR calc non Af Amer >60 >60 mL/min   GFR calc Af Amer >60 >60 mL/min  Rh IG workup (includes ABO/Rh)     Status: None (Preliminary result)   Collection Time: 06/14/15  4:35 PM  Result Value Ref Range   Gestational Age(Wks) 6    ABO/RH(D) O NEG    Antibody Screen NEG    Unit Number [id number]    Blood Component Type RHIG    Unit division 00    Status of Unit ISSUED  Transfusion Status OK TO TRANSFUSE      Assessment and Plan  A:  Ectopic pregnancy, right  P:  Consulted Dr Clearance Coots with presentation, exam findings and results.  Received orders for Methotrexate       Labs ordered pre-injection       Methotrexate given       Ectopic precautions       Return for Day # 4 labs                       [facility] 06/14/2015, 4:44 PM

## 2015-06-14 NOTE — Discharge Instructions (Signed)
°Methotrexate Treatment for an Ectopic Pregnancy °Methotrexate is a medicine that treats ectopic pregnancy by stopping the growth of the fertilized egg. It also helps your body absorb tissue from the egg. This takes between 2 weeks and 6 weeks. Most ectopic pregnancies can be successfully treated with methotrexate if they are detected early enough. °LET YOUR HEALTH CARE PROVIDER KNOW ABOUT: °· Any allergies you have. °· All medicines you are taking, including vitamins, herbs, eye drops, creams, and over-the-counter medicines. °· Medical conditions you have. °RISKS AND COMPLICATIONS °Generally, this is a safe treatment. However, as with any treatment, problems can occur. Possible problems or side effects include: °· Nausea. °· Vomiting. °· Diarrhea. °· Abdominal cramping. °· Mouth sores. °· Increased vaginal bleeding or spotting.   °· Swelling or irritation of the lining of your lungs (pneumonitis).  °· Failed treatment and continuation of the pregnancy.   °· Liver damage. °· Hair loss. °There is still a risk of the ectopic pregnancy rupturing while using the methotrexate. °BEFORE THE PROCEDURE °Before you take the medicine:  °· Liver tests, kidney tests, and a complete blood test are performed. °· Blood tests are performed to measure the pregnancy hormone levels and to determine your blood type. °· If you are Rh-negative and the father is Rh-positive or his Rh type is not known, you will be given a Rho (D) immune globulin shot. °PROCEDURE  °There are two methods that your health care provider may use to prescribe methotrexate. One method involves a single dose or injection of the medicine. Another method involves a series of doses given through several injections.  °AFTER THE PROCEDURE °· You may have some abdominal cramping, vaginal bleeding, and fatigue in the first few days after taking methotrexate. °· Blood tests will be taken for several weeks to check the pregnancy hormone levels. The blood tests are  performed until there is no more pregnancy hormone detected in the blood. °  °This information is not intended to replace advice given to you by your health care provider. Make sure you discuss any questions you have with your health care provider. °  °Document Released: 08/06/2001 Document Revised: 09/02/2014 Document Reviewed: 05/31/2013 °Elsevier Interactive Patient Education ©2016 [employer] ° °Methotrexate Treatment for an Ectopic Pregnancy, Care After °Refer to this sheet in the next few weeks. These instructions provide you with information on caring for yourself after your procedure. Your health care provider may also give you more specific instructions. Your treatment has been planned according to current medical practices, but problems sometimes occur. Call your health care provider if you have any problems or questions after your procedure. °WHAT TO EXPECT AFTER THE PROCEDURE °You may have some abdominal cramping, vaginal bleeding, and fatigue in the first few days after taking methotrexate. Some other possible side effects of methotrexate include: °· Nausea. °· Vomiting. °· Diarrhea. °· Mouth sores. °· Swelling or irritation of the lining of your lungs (pneumonitis). °· Liver damage. °· Hair loss. °HOME CARE INSTRUCTIONS  °After you have received the methotrexate medicine, you need to be careful of your activities and watch your condition for several weeks. It may take 1 week before your hormone levels return to normal. °· Keep all follow-up appointments as directed by your health care provider. °· Avoid traveling too far away from your health care provider. °· Do not have sexual intercourse until your health care provider says it is safe to do so. °· You may resume your usual diet. °· Limit strenuous activity. °· Do not take folic   acid, prenatal vitamins, or other vitamins that contain folic acid. °· Do not take aspirin, ibuprofen, or naproxen (nonsteroidal anti-inflammatory drugs [NSAIDs]). °· Do  not drink alcohol. °SEEK MEDICAL CARE IF:  °· You cannot control your nausea and vomiting. °· You cannot control your diarrhea. °· You have sores in your mouth and want treatment. °· You need pain medicine for your abdominal pain. °· You have a rash. °· You are having a reaction to the medicine. °SEEK IMMEDIATE MEDICAL CARE IF:  °· You have increasing abdominal or pelvic pain. °· You notice increased bleeding. °· You feel light-headed, or you faint. °· You have shortness of breath. °· Your heart rate increases. °· You have a cough. °· You have chills. °· You have a fever. °  °This information is not intended to replace advice given to you by your health care provider. Make sure you discuss any questions you have with your health care provider. °  °Document Released: 08/01/2011 Document Revised: 08/17/2013 Document Reviewed: 05/31/2013 °Elsevier Interactive Patient Education ©2016 Elsevier Inc. ° °

## 2015-06-14 NOTE — MAU Note (Signed)
Pt seen in MD office today, BHCG levels are dropping.  Pt had BTL in 2010, ? Ectopic pregnancy.  LLQ pain for the last 3-4 days, no bleeding.

## 2015-06-15 ENCOUNTER — Ambulatory Visit (HOSPITAL_COMMUNITY): Payer: PRIVATE HEALTH INSURANCE

## 2015-06-15 LAB — RH IG WORKUP (INCLUDES ABO/RH)
ABO/RH(D): O NEG
Antibody Screen: NEGATIVE
Gestational Age(Wks): 6
UNIT DIVISION: 0

## 2015-06-17 ENCOUNTER — Inpatient Hospital Stay (HOSPITAL_COMMUNITY)
Admission: AD | Admit: 2015-06-17 | Discharge: 2015-06-17 | Disposition: A | Discharge status: Home / Self Care | Payer: PRIVATE HEALTH INSURANCE | Source: Ambulatory Visit | Attending: Obstetrics | Admitting: Obstetrics

## 2015-06-17 DIAGNOSIS — O009 Unspecified ectopic pregnancy without intrauterine pregnancy: Secondary | ICD-10-CM | POA: Insufficient documentation

## 2015-06-17 DIAGNOSIS — N3000 Acute cystitis without hematuria: Secondary | ICD-10-CM | POA: Diagnosis not present

## 2015-06-17 DIAGNOSIS — O001 Tubal pregnancy without intrauterine pregnancy: Secondary | ICD-10-CM | POA: Diagnosis not present

## 2015-06-17 LAB — HCG, QUANTITATIVE, PREGNANCY: HCG, BETA CHAIN, QUANT, S: 842 m[IU]/mL — AB (ref <5)

## 2015-06-17 MED ORDER — SULFAMETHOXAZOLE-TRIMETHOPRIM 800-160 MG PO TABS: 1.0000 | ORAL_TABLET | Freq: Two times a day (BID) | ORAL | Status: DC

## 2015-06-17 MED ORDER — PHENAZOPYRIDINE HCL 200 MG PO TABS: 200.0000 mg | ORAL_TABLET | Freq: Three times a day (TID) | ORAL | Status: DC

## 2015-06-17 NOTE — MAU Provider Note (Signed)
Chief Complaint  Patient presents with  . Follow-up    Subjective:   Pt is a 34 y.o. [id number] here for follow-up BHCG.  Upon review of the records patient was first seen on 06-14-15 for positive pregnancy test after having BTL.   BHCG on that day was 1791.  Ultrasound showed no IUGS, no yolk sac, no embryo, mass in right adenexa suspicious for ectopic pregnancy. GC/CT and wet prep were not collected.   Pt was given methotrexate for ectopic pregnancy and discharged home 06-14-15.   Pt here today with no report of abdominal pain or vaginal bleeding.   All other systems negative.  Last seen in MAU on 06-14-15.      BHCG was 1891.    No past medical history on file.  OB History  Gravida Para Term Preterm AB SAB TAB Ectopic Multiple Living  4 3 3       3     # Outcome Date GA Lbr Len/2nd Weight Sex Delivery Anes PTL Lv  4 Current           3 Term 02/25/09    Genella MechM Vag-Spont EPI  Y  2 Term 12/20/05    F Vag-Spont   Y  1 Term 03/27/99    Genella MechM Vag-Spont   Y      No family history on file.  Objective: Physical Exam  There were no vitals filed for this visit. Constitutional: She is oriented to person, place, and time. She appears well-developed and well-nourished. No distress.  Pulmonary/Chest: Effort normal. No respiratory distress.  Musculoskeletal: Normal range of motion.  Neurological: She is alert and oriented to person, place, and time.  Skin: Skin is warm and dry.   Results for orders placed or performed during the hospital encounter of 06/17/15 (from the past 24 hour(s))  hCG, quantitative, pregnancy     Status: Abnormal   Collection Time: 06/17/15  9:10 AM  Result Value Ref Range   hCG, Beta Chain, Quant, S 842 (H) <5 mIU/mL     Assessment: 34 y.o. [id number] with significant drop of BHCG on day 4 following methotrexate  Plan: Return on Tuesday for Day 7 BHCG. Return sooner if develops pain or severe vaginal bleeding. Advised continued pelvic rest - no sex, no tampons, no  douching.

## 2015-06-17 NOTE — MAU Note (Signed)
Pt here for hcg check, 4 days post MTX.  No complaints

## 2015-06-18 ENCOUNTER — Inpatient Hospital Stay (HOSPITAL_COMMUNITY)
Admission: AD | Admit: 2015-06-18 | Discharge: 2015-06-19 | Disposition: A | Discharge status: Home / Self Care | Payer: PRIVATE HEALTH INSURANCE | Source: Ambulatory Visit | Attending: Obstetrics | Admitting: Obstetrics

## 2015-06-18 ENCOUNTER — Inpatient Hospital Stay (HOSPITAL_COMMUNITY): Payer: PRIVATE HEALTH INSURANCE

## 2015-06-18 DIAGNOSIS — Z87891 Personal history of nicotine dependence: Secondary | ICD-10-CM | POA: Insufficient documentation

## 2015-06-18 DIAGNOSIS — R109 Unspecified abdominal pain: Secondary | ICD-10-CM

## 2015-06-18 DIAGNOSIS — R030 Elevated blood-pressure reading, without diagnosis of hypertension: Secondary | ICD-10-CM | POA: Insufficient documentation

## 2015-06-18 DIAGNOSIS — O26899 Other specified pregnancy related conditions, unspecified trimester: Secondary | ICD-10-CM

## 2015-06-18 DIAGNOSIS — O008 Other ectopic pregnancy without intrauterine pregnancy: Secondary | ICD-10-CM | POA: Insufficient documentation

## 2015-06-18 DIAGNOSIS — O009 Unspecified ectopic pregnancy without intrauterine pregnancy: Secondary | ICD-10-CM

## 2015-06-18 DIAGNOSIS — O001 Tubal pregnancy without intrauterine pregnancy: Secondary | ICD-10-CM

## 2015-06-18 DIAGNOSIS — R110 Nausea: Secondary | ICD-10-CM | POA: Insufficient documentation

## 2015-06-18 DIAGNOSIS — N8312 Corpus luteum cyst of left ovary: Secondary | ICD-10-CM | POA: Insufficient documentation

## 2015-06-18 LAB — CBC
HEMATOCRIT: 35 % — AB (ref 36.0–46.0)
HEMOGLOBIN: 11.2 g/dL — AB (ref 12.0–15.0)
MCH: 27.1 pg (ref 26.0–34.0)
MCHC: 32 g/dL (ref 30.0–36.0)
MCV: 84.7 fL (ref 78.0–100.0)
Platelets: 254 10*3/uL (ref 150–400)
RBC: 4.13 MIL/uL (ref 3.87–5.11)
RDW: 14.1 % (ref 11.5–15.5)
WBC: 9.3 10*3/uL (ref 4.0–10.5)

## 2015-06-18 LAB — HCG, QUANTITATIVE, PREGNANCY: hCG, Beta Chain, Quant, S: 296 m[IU]/mL — ABNORMAL HIGH (ref <5)

## 2015-06-18 MED ORDER — KETOROLAC TROMETHAMINE 60 MG/2ML IM SOLN
60.0000 mg | Freq: Once | INTRAMUSCULAR | Status: AC
  Administered 2015-06-19: 60 mg via INTRAMUSCULAR
  Filled 2015-06-18: qty 2

## 2015-06-18 NOTE — MAU Note (Signed)
Pt states she has ectopic pregnancy and received MTX Tuesday, having LLQ pain worse than she had previously and some vaginal spotting that started an hour ago. Took tylenol at 6pm; did not work.

## 2015-06-18 NOTE — MAU Provider Note (Signed)
CSN: [id number]     Arrival date & time 06/18/15  2123 History   None    No chief complaint on file.    (Consider location/radiation/quality/duration/timing/severity/associated sxs/prior Treatment) HPI Pt is [redacted]w[redacted]d known ectopic seen here from his office on 06/14/2015.  She was initally seen in Dr. Verdell Carmine office on 10/17with HCG 1878  Then 1791 on 10/19 with MTX  Day 4 842  On 10/22 (yesterday) She has been taking Tylenol 2 tablets with control of pain but today pain has progressively gotten worse esp on left side and more intense around 4 pm/  Pt started spotting today about 6 pm Pt is now nauseated- no dizziness Pt last ate at 4 pm baked chicken, green beans mashed potatoes  No past medical history on file. Past Surgical History  Procedure Laterality Date  . Tubal ligation Bilateral    No family history on file. Social History  Substance Use Topics  . Smoking status: Former Games developer  . Smokeless tobacco: Not on file  . Alcohol Use: No   OB History    Gravida Para Term Preterm AB TAB SAB Ectopic Multiple Living   Results for Cindy, Wang (MRN [id number]) as of 06/18/2015 23:06  Ref. Range 06/14/2015 16:35 06/17/2015 09:10 06/18/2015 21:35 06/18/2015 22:44  HCG, Beta Chain, Quant, S Latest Ref Range: <5 mIU/mL  842 (H) 296 (H)    Review of Systems  Gastrointestinal: Positive for abdominal pain.  Genitourinary: Positive for pelvic pain. Negative for dysuria.  Neurological: Negative for dizziness.      Allergies  Review of patient's allergies indicates no known allergies.  Home Medications   Prior to Admission medications   Not on File   BP 136/99 mmHg  Pulse 68  Temp(Src) 98.6 F (37 C) (Oral)  Resp 18  Ht  (1.549 m)  Wt 177 lb 9.6 oz (80.559 kg)  BMI 33.57 kg/m2  SpO2 99%  LMP 05/03/2015 Physical Exam  Constitutional: She is oriented to person, place, and time. She appears well-developed and well-nourished. No distress.  HENT:   Head: Normocephalic.  Eyes: Pupils are equal, round, and reactive to light.  Neck: Normal range of motion. Neck supple.  Cardiovascular: Normal rate.   Pulmonary/Chest: Effort normal.  Abdominal: Soft. She exhibits no distension. There is tenderness. There is guarding. There is no rebound.  Musculoskeletal: Normal range of motion.  Neurological: She is alert and oriented to person, place, and time.  Skin: Skin is warm and dry.  Psychiatric: She has a normal mood and affect.  Nursing note and vitals reviewed.   ED Course  Procedures (including critical care time) Labs Review Labs Reviewed  CBC - Abnormal; Notable for the following:    Hemoglobin 11.2 (*)    HCT 35.0 (*)    All other components within normal limits  HCG, QUANTITATIVE, PREGNANCY   Results for orders placed or performed during the hospital encounter of 06/18/15 (from the past 24 hour(s))  CBC     Status: Abnormal   Collection Time: 06/18/15  9:35 PM  Result Value Ref Range   WBC 9.3 4.0 - 10.5 K/uL   RBC 4.13 3.87 - 5.11 MIL/uL   Hemoglobin 11.2 (L) 12.0 - 15.0 g/dL   HCT 78.2 (L) 95.6 - 21.3 %   MCV 84.7 78.0 - 100.0 fL   MCH 27.1 26.0 - 34.0 pg   MCHC 32.0 30.0 - 36.0 g/dL  RDW 14.1 11.5 - 15.5 %   Platelets 254 150 - 400 K/uL  hCG, quantitative, pregnancy     Status: Abnormal   Collection Time: 06/18/15  9:35 PM  Result Value Ref Range   hCG, Beta Chain, Quant, S 296 (H) <5 mIU/mL    Imaging Review No results found. I have personally reviewed and evaluated these images and lab results as part of my medical decision-making. [facility] Transvaginal  06/18/2015  CLINICAL DATA:  34 year old female with known right-sided ectopic pregnancy. Followup study. EXAM: TRANSVAGINAL OB ULTRASOUND TECHNIQUE: Transvaginal ultrasound was performed for complete evaluation of the gestation as well as the maternal uterus, adnexal regions, and pelvic cul-de-sac. COMPARISON:  Pelvic ultrasound 06/14/2015. FINDINGS: Intrauterine  gestational sac: None. Yolk sac:  None. Embryo:  None. Cardiac Activity: None. Heart Rate: N/A Maternal uterus/adnexae: Previously noted complex cystic lesion in the right adnexa is not appreciated on today's examination. Bilateral ovaries are normal in appearance. Probable corpus luteum cyst in the left ovary. Trace volume of free fluid in the cul-de-sac. IMPRESSION: 1. Resolution of previously noted complex cystic lesion in the right adnexa, suggestive of a resolved ectopic. Electronically Signed   By: Trudie Reedaniel  Entrikin M.D.   On: 06/18/2015 22:51    EKG Interpretation None      MDM   Final diagnoses:  Abdominal pain in pregnancy   toradol 60mg  IM given for pain Discussed with Dr. Clearance CootsHarper Imp-abdominal pain/Right  ectopic pregnancy- resolving Left corpus luteum ovarian cyst F/u for day 7 HCG as previously scheduled  Cindy HoitSusan Lineberry, NP  [phone] Notified by Cindy Angathy RN that blood pressures elevated 147/109; Upon review of records found an elevated blood pressure at an office visit.  Dr. Clearance CootsHarper notified > begin Coreg 6.25 and HCTZ 12.5 mg and follow-up in office as scheduled; follow-up with PCP for BP maintenance.    Cindy Wang, CNM

## 2015-06-19 ENCOUNTER — Encounter (HOSPITAL_COMMUNITY): Payer: Self-pay | Admitting: Family

## 2015-06-19 MED ORDER — CARVEDILOL 6.25 MG PO TABS: 6.2500 mg | ORAL_TABLET | Freq: Two times a day (BID) | ORAL | Status: DC

## 2015-06-19 MED ORDER — HYDROCHLOROTHIAZIDE 12.5 MG PO CAPS: 12.5000 mg | ORAL_CAPSULE | Freq: Every day | ORAL | Status: DC

## 2015-06-20 ENCOUNTER — Inpatient Hospital Stay (HOSPITAL_COMMUNITY)
Admission: AD | Admit: 2015-06-20 | Discharge: 2015-06-20 | Disposition: A | Discharge status: Home / Self Care | Payer: PRIVATE HEALTH INSURANCE | Source: Ambulatory Visit | Attending: Obstetrics | Admitting: Obstetrics

## 2015-06-20 DIAGNOSIS — O009 Unspecified ectopic pregnancy without intrauterine pregnancy: Secondary | ICD-10-CM | POA: Insufficient documentation

## 2015-06-20 LAB — HCG, QUANTITATIVE, PREGNANCY: hCG, Beta Chain, Quant, S: 112 m[IU]/mL — ABNORMAL HIGH (ref <5)

## 2015-06-20 MED ORDER — OXYCODONE-ACETAMINOPHEN 5-325 MG PO TABS: 2.0000 | ORAL_TABLET | ORAL | Status: DC | PRN

## 2015-06-20 NOTE — MAU Note (Signed)
Pt not in lobby, told front desk, that she would return in an hour (after blood was drawn)

## 2015-06-20 NOTE — MAU Note (Signed)
PT  HAS RETURNED  FOR  7 TH DAY   BLOOD  DRAW.  SHE  RECEIVED  METH OTREXATE  ON 10-19.     WAS HERE Sunday NIGHT -  PAIN WAS 8-9 - THINKING  FROM A CYST.       TODAY PAIN IS  AN 8-  LOWER  CRAMPING.  SHE  TOOK TYLENOL AT 5PM   2 TABS OF 500 MG.-    NO RELIEF.          HER BLEEDING  ON Sunday  WAS  SPOTTING.     BUT BLOOD  STARTED  FLOWING  AT 0520  THIS  MORN- SOAKED PANTYLINER.      NOW  BLOOD  LESS RED-, PASSED  BLOOD  CLOTS   2  GOLF BALL SIZE .    IN TRIAGE - PAD-    SMALL  AMT  LIGHT  RED.

## 2015-06-27 ENCOUNTER — Inpatient Hospital Stay (HOSPITAL_COMMUNITY)
Admission: AD | Admit: 2015-06-27 | Discharge: 2015-06-27 | Disposition: A | Discharge status: Home / Self Care | Payer: Medicaid Other | Source: Ambulatory Visit | Attending: Obstetrics | Admitting: Obstetrics

## 2015-06-27 ENCOUNTER — Encounter (HOSPITAL_COMMUNITY): Payer: Self-pay | Admitting: *Deleted

## 2015-06-27 DIAGNOSIS — O009 Unspecified ectopic pregnancy without intrauterine pregnancy: Secondary | ICD-10-CM | POA: Diagnosis present

## 2015-06-27 DIAGNOSIS — Z87891 Personal history of nicotine dependence: Secondary | ICD-10-CM | POA: Diagnosis not present

## 2015-06-27 LAB — HCG, QUANTITATIVE, PREGNANCY: hCG, Beta Chain, Quant, S: 9 m[IU]/mL — ABNORMAL HIGH (ref <5)

## 2015-06-27 NOTE — Discharge Instructions (Signed)
Methotrexate Treatment for an Ectopic Pregnancy, Care After Refer to this sheet in the next few weeks. These instructions provide you with information on caring for yourself after your procedure. Your health care provider may also give you more specific instructions. Your treatment has been planned according to current medical practices, but problems sometimes occur. Call your health care provider if you have any problems or questions after your procedure. WHAT TO EXPECT AFTER THE PROCEDURE You may have some abdominal cramping, vaginal bleeding, and fatigue in the first few days after taking methotrexate. Some other possible side effects of methotrexate include:  Nausea.  Vomiting.  Diarrhea.  Mouth sores.  Swelling or irritation of the lining of your lungs (pneumonitis).  Liver damage.  Hair loss. HOME CARE INSTRUCTIONS  After you have received the methotrexate medicine, you need to be careful of your activities and watch your condition for several weeks. It may take 1 week before your hormone levels return to normal.  Keep all follow-up appointments as directed by your health care provider.  Avoid traveling too far away from your health care provider.  Do not have sexual intercourse until your health care provider says it is safe to do so.  You may resume your usual diet.  Limit strenuous activity.  Do not take folic acid, prenatal vitamins, or other vitamins that contain folic acid.  Do not take aspirin, ibuprofen, or naproxen (nonsteroidal anti-inflammatory drugs [NSAIDs]).  Do not drink alcohol. SEEK MEDICAL CARE IF:   You cannot control your nausea and vomiting.  You cannot control your diarrhea.  You have sores in your mouth and want treatment.  You need pain medicine for your abdominal pain.  You have a rash.  You are having a reaction to the medicine. SEEK IMMEDIATE MEDICAL CARE IF:   You have increasing abdominal or pelvic pain.  You notice increased  bleeding.  You feel light-headed, or you faint.  You have shortness of breath.  Your heart rate increases.  You have a cough.  You have chills.  You have a fever.   This information is not intended to replace advice given to you by your health care provider. Make sure you discuss any questions you have with your health care provider.   Document Released: 08/01/2011 Document Revised: 08/17/2013 Document Reviewed: 05/31/2013 Elsevier Interactive Patient Education 2016 [employer].   Hypertension Hypertension, commonly called high blood pressure, is when the force of blood pumping through your arteries is too strong. Your arteries are the blood vessels that carry blood from your heart throughout your body. A blood pressure reading consists of a higher number over a lower number, such as 110/72. The higher number (systolic) is the pressure inside your arteries when your heart pumps. The lower number (diastolic) is the pressure inside your arteries when your heart relaxes. Ideally you want your blood pressure below 120/80. Hypertension forces your heart to work harder to pump blood. Your arteries may become narrow or stiff. Having untreated or uncontrolled hypertension can cause heart attack, stroke, kidney disease, and other problems. RISK FACTORS Some risk factors for high blood pressure are controllable. Others are not.  Risk factors you cannot control include:   Race. You may be at higher risk if you are African American.  Age. Risk increases with age.  Gender. Men are at higher risk than women before age 64 years. After age 30, women are at higher risk than men. Risk factors you can control include:  Not getting enough exercise or  physical activity.  Being overweight.  Getting too much fat, sugar, calories, or salt in your diet.  Drinking too much alcohol. SIGNS AND SYMPTOMS Hypertension does not usually cause signs or symptoms. Extremely high blood pressure (hypertensive  crisis) may cause headache, anxiety, shortness of breath, and nosebleed. DIAGNOSIS To check if you have hypertension, your health care provider will measure your blood pressure while you are seated, with your arm held at the level of your heart. It should be measured at least twice using the same arm. Certain conditions can cause a difference in blood pressure between your right and left arms. A blood pressure reading that is higher than normal on one occasion does not mean that you need treatment. If it is not clear whether you have high blood pressure, you may be asked to return on a different day to have your blood pressure checked again. Or, you may be asked to monitor your blood pressure at home for 1 or more weeks. TREATMENT Treating high blood pressure includes making lifestyle changes and possibly taking medicine. Living a healthy lifestyle can help lower high blood pressure. You may need to change some of your habits. Lifestyle changes may include:  Following the DASH diet. This diet is high in fruits, vegetables, and whole grains. It is low in salt, red meat, and added sugars.  Keep your sodium intake below 2,300 mg per day.  Getting at least 30-45 minutes of aerobic exercise at least 4 times per week.  Losing weight if necessary.  Not smoking.  Limiting alcoholic beverages.  Learning ways to reduce stress. Your health care provider may prescribe medicine if lifestyle changes are not enough to get your blood pressure under control, and if one of the following is true:  You are 8018-35 years of age and your systolic blood pressure is above 140.  You are 35 years of age or older, and your systolic blood pressure is above 150.  Your diastolic blood pressure is above 90.  You have diabetes, and your systolic blood pressure is over 140 or your diastolic blood pressure is over 90.  You have kidney disease and your blood pressure is above 140/90.  You have heart disease and your  blood pressure is above 140/90. Your personal target blood pressure may vary depending on your medical conditions, your age, and other factors. HOME CARE INSTRUCTIONS  Have your blood pressure rechecked as directed by your health care provider.   Take medicines only as directed by your health care provider. Follow the directions carefully. Blood pressure medicines must be taken as prescribed. The medicine does not work as well when you skip doses. Skipping doses also puts you at risk for problems.  Do not smoke.   Monitor your blood pressure at home as directed by your health care provider. SEEK MEDICAL CARE IF:   You think you are having a reaction to medicines taken.  You have recurrent headaches or feel dizzy.  You have swelling in your ankles.  You have trouble with your vision. SEEK IMMEDIATE MEDICAL CARE IF:  You develop a severe headache or confusion.  You have unusual weakness, numbness, or feel faint.  You have severe chest or abdominal pain.  You vomit repeatedly.  You have trouble breathing. MAKE SURE YOU:   Understand these instructions.  Will watch your condition.  Will get help right away if you are not doing well or get worse.   This information is not intended to replace advice given to you  by your health care provider. Make sure you discuss any questions you have with your health care provider.   Document Released: 08/12/2005 Document Revised: 12/27/2014 Document Reviewed: 06/04/2013 Elsevier Interactive Patient Education [employer]

## 2015-06-27 NOTE — MAU Note (Signed)
PT  SAYS  SHE WAS HERE  LAST  Tuesday  FOR  LABS-   TO RETURN  TODAY  FOR  LABS   .  LAST  Tuesday   HER PAIN  WAS AN 8  --    AND  NOW   NO PAIN.      LAST    Tuesday   HER  BLEEDING-      WAS SMALL AMT-     NOW    NO BLEEDING  - NO SPOTTING.

## 2015-06-27 NOTE — MAU Provider Note (Signed)
History   [id number]   Chief Complaint  Patient presents with  . Follow-up  . Ectopic Pregnancy    HPI Cindy Wang is a 34 y.o. female [phone] here for follow-up BHCG.  Upon review of the records patient was first seen on 10/19 for abdominal pain.   BHCG on that day was 1791.  Ultrasound showed right adnexal mass suspicious for ectopic pregnancy with no IUP. Pt discharged home.Pt here today with no report of abdominal pain or vaginal bleeding.   All other systems negative.  Last seen in MAU on 10/25. BHCG was 112.    Patient's last menstrual period was 05/03/2015.  OB History  Gravida Para Term Preterm AB SAB TAB Ectopic Multiple Living  4 3 3       3     # Outcome Date GA Lbr Len/2nd Weight Sex Delivery Anes PTL Lv  4 Current           3 Term 02/25/09    Genella MechM Vag-Spont EPI  Y  2 Term 12/20/05    F Vag-Spont   Y  1 Term 03/27/99    Genella MechM Vag-Spont   Y      No past medical history on file.  No family history on file.  Social History   Social History  . Marital Status: Married    Spouse Name: N/A  . Number of Children: N/A  . Years of Education: N/A   Social History Main Topics  . Smoking status: Former Games developermoker  . Smokeless tobacco: None  . Alcohol Use: No  . Drug Use: No  . Sexual Activity:    Partners: Male    Birth Control/ Protection: Surgical   Other Topics Concern  . None   Social History Narrative    No Known Allergies  No current facility-administered medications on file prior to encounter.   Current Outpatient Prescriptions on File Prior to Encounter  Medication Sig Dispense Refill  . acetaminophen (TYLENOL) 500 MG tablet Take 1,000 mg by mouth every 6 (six) hours as needed for mild pain or headache.    . carvedilol (COREG) 6.25 MG tablet Take 1 tablet (6.25 mg total) by mouth 2 (two) times daily with a meal. 60 tablet 2  . hydrochlorothiazide (MICROZIDE) 12.5 MG capsule Take 1 capsule (12.5 mg total) by mouth daily. 60 capsule 1  .  oxyCODONE-acetaminophen (PERCOCET/ROXICET) 5-325 MG tablet Take 2 tablets by mouth every 4 (four) hours as needed for severe pain. 15 tablet 0     Physical Exam   Filed Vitals:   06/27/15 2114  BP: 137/99  Pulse: 111  Temp: 98.6 F (37 C)  TempSrc: Oral  Resp: 20  Height: 5\' 1"  (1.549 m)  Weight: 172 lb 2 oz (78.075 kg)    Physical Exam  Nursing note and vitals reviewed. Constitutional: She is oriented to person, place, and time. She appears well-developed and well-nourished. No distress.  HENT:  Head: Normocephalic and atraumatic.  Eyes: Conjunctivae are normal. Right eye exhibits no discharge. Left eye exhibits no discharge. No scleral icterus.  Neck: Normal range of motion.  Cardiovascular:  No murmur heard. Respiratory: Effort normal. No respiratory distress.  Neurological: She is alert and oriented to person, place, and time.  Skin: Skin is warm and dry. She is not diaphoretic.  Psychiatric: She has a normal mood and affect. Her behavior is normal. Judgment and thought content normal.    MAU Course  Procedures Component     Latest Ref Rng 06/12/2015  06/14/2015 06/17/2015 06/18/2015  HCG, Beta Chain, Quant, S     <5 mIU/mL 1878.9 1791.6 (H) 842 (H) 296 (H)   Component     Latest Ref Rng 06/20/2015 06/27/2015  HCG, Beta Chain, Quant, S     <5 mIU/mL 112 (H) 9 (H)   MDM O negative - rhophylac given on 10/19 2114- Discussed BHCG result with Dr. Clearance Coots. Ok to discharge home. F/u in office in 1 week.   Assessment and Plan  34 y.o. G4P3003 at [redacted]w[redacted]d wks Pregnancy Follow-up BHCG Pregnancy of Unknown Location 1. Ectopic pregnancy     P: Discharge home Schedule f/u with [facility] in 1 week.  Continue taking antihypertensive medication Discussed reasons to return to ED (chest pain, severe headache, stroke symptoms, SOB, severe htn).  Judeth Horn, NP 06/27/2015 9:12 PM

## 2015-07-03 ENCOUNTER — Other Ambulatory Visit: Payer: Self-pay | Admitting: *Deleted

## 2015-07-03 MED ORDER — CARVEDILOL 6.25 MG PO TABS: 6.2500 mg | ORAL_TABLET | Freq: Two times a day (BID) | ORAL | Status: DC

## 2015-07-03 MED ORDER — HYDROCHLOROTHIAZIDE 12.5 MG PO CAPS: 12.5000 mg | ORAL_CAPSULE | Freq: Every day | ORAL | Status: DC

## 2015-07-04 ENCOUNTER — Encounter: Payer: Self-pay | Admitting: Obstetrics

## 2015-07-04 ENCOUNTER — Ambulatory Visit (INDEPENDENT_AMBULATORY_CARE_PROVIDER_SITE_OTHER): Payer: PRIVATE HEALTH INSURANCE | Admitting: Obstetrics

## 2015-07-04 VITALS — BP 132/94 | HR 101 | Temp 98.5°F | Wt 173.0 lb

## 2015-07-04 DIAGNOSIS — O00109 Unspecified tubal pregnancy without intrauterine pregnancy: Secondary | ICD-10-CM

## 2015-07-04 DIAGNOSIS — Z30011 Encounter for initial prescription of contraceptive pills: Secondary | ICD-10-CM | POA: Diagnosis not present

## 2015-07-04 DIAGNOSIS — I10 Essential (primary) hypertension: Secondary | ICD-10-CM | POA: Diagnosis not present

## 2015-07-04 MED ORDER — LEVONORGESTREL-ETHINYL ESTRAD 0.15-30 MG-MCG PO TABS: 1.0000 | ORAL_TABLET | Freq: Every day | ORAL | Status: DC

## 2015-07-04 NOTE — Progress Notes (Signed)
Patient ID: Cindy Wang, female   DOB: 10/15/1980, 34 y.o.   MRN: [id number]  Chief Complaint  Patient presents with  . Advice Only    HPI Cindy Wang is a 34 y.o. female.  H/O ectopic pregnancy after failed Laparoscopic tubal.  Received MTX and now following quantitative beta HCG's.  Patient has no complaints of pain or bleeding today.  Quant. = 9 on 06-27-15.  Presents today for F/U Quant.   HPI  History reviewed. No pertinent past medical history.  Past Surgical History  Procedure Laterality Date  . Tubal ligation Bilateral     History reviewed. No pertinent family history.  Social History Social History  Substance Use Topics  . Smoking status: Former Games developer  . Smokeless tobacco: None  . Alcohol Use: No    No Known Allergies  Current Outpatient Prescriptions  Medication Sig Dispense Refill  . acetaminophen (TYLENOL) 500 MG tablet Take 1,000 mg by mouth every 6 (six) hours as needed for mild pain or headache.    . carvedilol (COREG) 6.25 MG tablet Take 1 tablet (6.25 mg total) by mouth 2 (two) times daily with a meal. 60 tablet 2  . hydrochlorothiazide (MICROZIDE) 12.5 MG capsule Take 1 capsule (12.5 mg total) by mouth daily. 60 capsule 1  . levonorgestrel-ethinyl estradiol (NORDETTE) 0.15-30 MG-MCG tablet Take 1 tablet by mouth daily. 1 Package 11  . oxyCODONE-acetaminophen (PERCOCET/ROXICET) 5-325 MG tablet Take 2 tablets by mouth every 4 (four) hours as needed for severe pain. (Patient not taking: Reported on 07/04/2015) 15 tablet 0   No current facility-administered medications for this visit.    Review of Systems Review of Systems Constitutional: negative for fatigue and weight loss Respiratory: negative for cough and wheezing Cardiovascular: negative for chest pain, fatigue and palpitations Gastrointestinal: negative for abdominal pain and change in bowel habits Genitourinary:negative Integument/breast: negative for nipple  discharge Musculoskeletal:negative for myalgias Neurological: negative for gait problems and tremors Behavioral/Psych: negative for abusive relationship, depression Endocrine: negative for temperature intolerance     Blood pressure 132/94, pulse 101, temperature 98.5 F (36.9 C), weight 173 lb (78.472 kg), last menstrual period 05/03/2015, unknown if currently breastfeeding.  Physical Exam:   Physical Exam: Deferred                                                                                                                                                                                  100% of 10 min visit spent on counseling and coordination of care.   Data Reviewed Labs  Assessment     Tubal ectopic pregnancy.  Resolving with MTX.  Contraceptive counseling and advice.  Interested in the pill for now. Hypertension  Plan    Quantitative beta HCG today                                                                                                                                                                                 Coreg / Dyazide started.  Stable.  Referred to Internal Medicine. Patient wants referral to Reproductive Endocrinology for consultation for surgical treatment of failed tubal and ART for another pregnancy.  Orders Placed This Encounter  Procedures  . B-HCG Quant  . Ambulatory referral to Endocrinology    Referral Priority:  Routine    Referral Type:  Consultation    Referral Reason:  Specialty Services Required    Number of Visits Requested:  1  . Ambulatory referral to Internal Medicine    Referral Priority:  Routine    Referral Type:  Consultation    Referral Reason:  Specialty Services Required    Requested Specialty:  Internal Medicine    Number of Visits Requested:  1   Meds ordered this encounter  Medications  . levonorgestrel-ethinyl estradiol (NORDETTE) 0.15-30 MG-MCG tablet    Sig: Take 1 tablet by mouth daily.    Dispense:  1 Package     Refill:  11

## 2015-07-05 ENCOUNTER — Telehealth: Payer: Self-pay

## 2015-07-05 LAB — HCG, QUANTITATIVE, PREGNANCY: hCG, Beta Chain, Quant, S: <2 m[IU]/mL

## 2015-07-05 NOTE — Telephone Encounter (Signed)
Patient has been referred to PCP for hypertension and has appt on 07/17/15 at 9:30am - called to let her know - seeing if either Dr. Lyndal RainbowYalcinkaya's office or Dr. Regan RakersMonica Doerr's office will see her for endocrinology - her insurance an issue for infertility

## 2015-07-12 ENCOUNTER — Telehealth: Payer: Self-pay

## 2015-07-12 NOTE — Telephone Encounter (Signed)
DR. Oneita KrasERR'S OFFICE COULD NOT SEE PATIENT, DR Lyndal RainbowYALCINKAYA'S OFFICE NOT IN NETWORK WITH PATIENT'S INSURANCE AND WOULD HAVE TO BE SELF-PAY, PATIENT SAID WOULD CONTACT THEM WHEN SHE CAN DO IT

## 2015-09-22 ENCOUNTER — Other Ambulatory Visit: Payer: Self-pay | Admitting: Obstetrics

## 2015-10-04 ENCOUNTER — Emergency Department (HOSPITAL_COMMUNITY)
Admission: EM | Admit: 2015-10-04 | Discharge: 2015-10-04 | Disposition: A | Discharge status: Home / Self Care | Payer: Medicaid Other | Attending: Emergency Medicine | Admitting: Emergency Medicine

## 2015-10-04 ENCOUNTER — Emergency Department (HOSPITAL_COMMUNITY): Payer: Medicaid Other

## 2015-10-04 ENCOUNTER — Encounter (HOSPITAL_COMMUNITY): Payer: Self-pay | Admitting: Emergency Medicine

## 2015-10-04 DIAGNOSIS — S63602A Unspecified sprain of left thumb, initial encounter: Secondary | ICD-10-CM | POA: Diagnosis not present

## 2015-10-04 DIAGNOSIS — Z87891 Personal history of nicotine dependence: Secondary | ICD-10-CM | POA: Diagnosis not present

## 2015-10-04 DIAGNOSIS — W01198A Fall on same level from slipping, tripping and stumbling with subsequent striking against other object, initial encounter: Secondary | ICD-10-CM | POA: Insufficient documentation

## 2015-10-04 DIAGNOSIS — Y998 Other external cause status: Secondary | ICD-10-CM | POA: Diagnosis not present

## 2015-10-04 DIAGNOSIS — S6992XA Unspecified injury of left wrist, hand and finger(s), initial encounter: Secondary | ICD-10-CM | POA: Diagnosis present

## 2015-10-04 DIAGNOSIS — Z79899 Other long term (current) drug therapy: Secondary | ICD-10-CM | POA: Diagnosis not present

## 2015-10-04 DIAGNOSIS — I10 Essential (primary) hypertension: Secondary | ICD-10-CM | POA: Insufficient documentation

## 2015-10-04 DIAGNOSIS — Y9289 Other specified places as the place of occurrence of the external cause: Secondary | ICD-10-CM | POA: Diagnosis not present

## 2015-10-04 DIAGNOSIS — Y9389 Activity, other specified: Secondary | ICD-10-CM | POA: Diagnosis not present

## 2015-10-04 DIAGNOSIS — Z793 Long term (current) use of hormonal contraceptives: Secondary | ICD-10-CM | POA: Insufficient documentation

## 2015-10-04 HISTORY — DX: Unspecified ectopic pregnancy without intrauterine pregnancy: O00.90

## 2015-10-04 HISTORY — DX: Essential (primary) hypertension: I10

## 2015-10-04 MED ORDER — HYDROCODONE-ACETAMINOPHEN 5-325 MG PO TABS: 1.0000 | ORAL_TABLET | Freq: Four times a day (QID) | ORAL | Status: DC | PRN

## 2015-10-04 NOTE — ED Notes (Signed)
Patient states she does have high blood pressure and takes medication for it. She did take it this morning.

## 2015-10-04 NOTE — ED Provider Notes (Signed)
CSN: [id number]     Arrival date & time 10/04/15  0905 History  By signing my name below, I, Soijett Blue, attest that this documentation has been prepared under the direction and in the presence of Roxy Horseman, PA-C Electronically Signed: Soijett Blue, ED Scribe. 10/04/2015. 9:20 AM.     Chief Complaint  Patient presents with  . Hand Injury      The history is provided by the patient. No language interpreter was used.    Keiry A Haggar is a 35 y.o. female with a PMHx of HTN, who presents to the Emergency Department complaining of left hand injury occurring this morning PTA. She notes that she had a mechanical fall onto her left thumb due to tripping over a shoe and her left thumb bent backwards at the time of the fall. She states that she had immediate pain to her left thumb. She reports that she is able to move her left thumb, but there is pain with extension of the thumb. Pt denies pain to any other part of her body. Pt is having associated symptoms of left thumb swelling. She notes that she has not tried any medications for the relief of her symptoms. She denies color change, wound, rash, left wrist pain, and any other symptoms.    Past Medical History  Diagnosis Date  . Hypertension   . Ectopic pregnancy    Past Surgical History  Procedure Laterality Date  . Tubal ligation Bilateral    No family history on file. Social History  Substance Use Topics  . Smoking status: Former Games developer  . Smokeless tobacco: None  . Alcohol Use: No   OB History    Gravida Para Term Preterm AB TAB SAB Ectopic Multiple Living   Review of Systems  Musculoskeletal: Positive for joint swelling and arthralgias.  Skin: Negative for color change, rash and wound.      Allergies  Review of patient's allergies indicates no known allergies.  Home Medications   Prior to Admission medications   Medication Sig Start Date End Date Taking? Authorizing Provider  acetaminophen  (TYLENOL) 500 MG tablet Take 1,000 mg by mouth every 6 (six) hours as needed for mild pain or headache.    Historical Provider, MD  carvedilol (COREG) 6.25 MG tablet Take 1 tablet (6.25 mg total) by mouth 2 (two) times daily with a meal. 07/03/15   Brock Bad, MD  hydrochlorothiazide (MICROZIDE) 12.5 MG capsule Take 1 capsule (12.5 mg total) by mouth daily. 07/03/15   Brock Bad, MD  levonorgestrel-ethinyl estradiol (NORDETTE) 0.15-30 MG-MCG tablet Take 1 tablet by mouth daily. 07/04/15   Brock Bad, MD  oxyCODONE-acetaminophen (PERCOCET/ROXICET) 5-325 MG tablet Take 2 tablets by mouth every 4 (four) hours as needed for severe pain. Patient not taking: Reported on 07/04/2015 06/20/15   Jean Rosenthal, NP   BP 153/107 mmHg  Pulse 84  Temp(Src) 98.3 F (36.8 C) (Oral)  Resp 16  SpO2 99%  LMP 05/03/2015 Physical Exam Physical Exam  Constitutional: Pt appears well-developed and well-nourished. No distress.  HENT:  Head: Normocephalic and atraumatic.  Eyes: Conjunctivae are normal.  Neck: Normal range of motion.  Cardiovascular: Normal rate, regular rhythm and intact distal pulses.   Capillary refill < 3 sec  Pulmonary/Chest: Effort normal and breath sounds normal.  Musculoskeletal: Pt exhibits tenderness over the left thumb at the MCP and thenar eminence. No bony abnormality  or deformity. Left wrist without tenderness or deformity. Nl ROM and strength. Pt exhibits no edema.  ROM: 3/5 limited by pain.  Neurological: Pt  is alert. Coordination normal.  Sensation 5/5 Strength 3/5 limited by pain.  Skin: Skin is warm and dry. Pt is not diaphoretic.  No tenting of the skin  Psychiatric: Pt has a normal mood and affect.  Nursing note and vitals reviewed.  ED Course  Procedures (including critical care time) DIAGNOSTIC STUDIES: Oxygen Saturation is 99% on RA, nl by my interpretation.    COORDINATION OF CARE: 9:18 AM Discussed treatment plan with pt at bedside which  includes left hand xray, splint, referral and follow up with orthopedist, and pt agreed to plan.     Imaging Review Dg Hand Complete Left  10/04/2015  CLINICAL DATA:  complaining of left hand injury occurring this morning PTA. She notes that she had a mechanical fall onto her left thumb due to tripping over a shoe and her left thumb bent backwards at the time of the fall. Mild immobility in thumb. EXAM: LEFT HAND - COMPLETE 3+ VIEW COMPARISON:  None. FINDINGS: No evidence of fracture of the carpal or metacarpal bones. Radiocarpal joint is intact. Phalanges are normal. No soft tissue injury. IMPRESSION: No fracture or dislocation. Electronically Signed   By: Genevive Bi M.D.   On: 10/04/2015 09:53   I have personally reviewed and evaluated these images as part of my medical decision-making.    MDM   Final diagnoses:  Thumb sprain, left, initial encounter    Patient X-Ray negative for obvious fracture or dislocation.  Pt advised to follow up with orthopedics. Patient given splint while in ED, conservative therapy recommended and discussed. Patient will be discharged home & is agreeable with above plan. Returns precautions discussed. Pt appears safe for discharge.  I personally performed the services described in this documentation, which was scribed in my presence. The recorded information has been reviewed and is accurate.      Roxy Horseman, PA-C 10/04/15 1040  Tilden Fossa, MD 10/05/15 0700

## 2015-10-04 NOTE — ED Notes (Signed)
Patient states mechanical fall this morning when she tripped over a shoe.   Patient states landed on her L hand with her thumb having been pushed backwards.   Patient complains of swelling and pain.

## 2015-10-04 NOTE — ED Notes (Signed)
Given ice pack

## 2015-10-04 NOTE — Discharge Instructions (Signed)
Finger Sprain A finger sprain is a tear in one of the strong, fibrous tissues that connect the bones (ligaments) in your finger. The severity of the sprain depends on how much of the ligament is torn. The tear can be either partial or complete. CAUSES  Often, sprains are a result of a fall or accident. If you extend your hands to catch an object or to protect yourself, the force of the impact causes the fibers of your ligament to stretch too much. This excess tension causes the fibers of your ligament to tear. SYMPTOMS  You may have some loss of motion in your finger. Other symptoms include:  Bruising.  Tenderness.  Swelling. DIAGNOSIS  In order to diagnose finger sprain, your caregiver will physically examine your finger or thumb to determine how torn the ligament is. Your caregiver may also suggest an X-ray exam of your finger to make sure no bones are broken. TREATMENT  If your ligament is only partially torn, treatment usually involves keeping the finger in a fixed position (immobilization) for a short period. To do this, your caregiver will apply a bandage, cast, or splint to keep your finger from moving until it heals. For a partially torn ligament, the healing process usually takes 2 to 3 weeks. If your ligament is completely torn, you may need surgery to reconnect the ligament to the bone. After surgery a cast or splint will be applied and will need to stay on your finger or thumb for 4 to 6 weeks while your ligament heals. HOME CARE INSTRUCTIONS  Keep your injured finger elevated, when possible, to decrease swelling.  To ease pain and swelling, apply ice to your joint twice a day, for 2 to 3 days:  Put ice in a plastic bag.  Place a towel between your skin and the bag.  Leave the ice on for 15 minutes.  Only take over-the-counter or prescription medicine for pain as directed by your caregiver.  Do not wear rings on your injured finger.  Do not leave your finger unprotected  until pain and stiffness go away (usually 3 to 4 weeks).  Do not allow your cast or splint to get wet. Cover your cast or splint with a plastic bag when you shower or bathe. Do not swim.  Your caregiver may suggest special exercises for you to do during your recovery to prevent or limit permanent stiffness. SEEK IMMEDIATE MEDICAL CARE IF:  Your cast or splint becomes damaged.  Your pain becomes worse rather than better. MAKE SURE YOU:  Understand these instructions.  Will watch your condition.  Will get help right away if you are not doing well or get worse.   This information is not intended to replace advice given to you by your health care provider. Make sure you discuss any questions you have with your health care provider.   Document Released: 09/19/2004 Document Revised: 09/02/2014 Document Reviewed: 04/15/2011 Elsevier Interactive Patient Education 2016 [employer]  

## 2015-12-25 ENCOUNTER — Other Ambulatory Visit: Payer: Self-pay | Admitting: Obstetrics

## 2015-12-27 NOTE — Telephone Encounter (Signed)
Please review

## 2015-12-28 ENCOUNTER — Other Ambulatory Visit: Payer: Self-pay | Admitting: Obstetrics

## 2016-03-25 ENCOUNTER — Other Ambulatory Visit: Payer: Self-pay | Admitting: Obstetrics

## 2016-03-25 ENCOUNTER — Telehealth: Payer: Self-pay | Admitting: *Deleted

## 2016-03-25 NOTE — Telephone Encounter (Signed)
Patient has no primary care provider and we are the last provider to fill her Carvedilol 6.5 mg. Can we refill and make her a referral to primary care?

## 2016-03-26 ENCOUNTER — Other Ambulatory Visit: Payer: Self-pay | Admitting: Obstetrics

## 2016-03-26 DIAGNOSIS — I10 Essential (primary) hypertension: Secondary | ICD-10-CM

## 2016-03-26 MED ORDER — CARVEDILOL 6.25 MG PO TABS: 6.2500 mg | ORAL_TABLET | Freq: Two times a day (BID) | ORAL | 2 refills | Status: DC

## 2016-03-26 NOTE — Telephone Encounter (Signed)
Coreg Rx Referred to IM for HTN

## 2016-04-02 ENCOUNTER — Emergency Department (HOSPITAL_COMMUNITY)
Admission: EM | Admit: 2016-04-02 | Discharge: 2016-04-02 | Disposition: A | Discharge status: Home / Self Care | Payer: Medicaid Other | Attending: Emergency Medicine | Admitting: Emergency Medicine

## 2016-04-02 ENCOUNTER — Encounter (HOSPITAL_COMMUNITY): Payer: Self-pay | Admitting: Vascular Surgery

## 2016-04-02 DIAGNOSIS — Z87891 Personal history of nicotine dependence: Secondary | ICD-10-CM | POA: Insufficient documentation

## 2016-04-02 DIAGNOSIS — I10 Essential (primary) hypertension: Secondary | ICD-10-CM | POA: Diagnosis not present

## 2016-04-02 DIAGNOSIS — R51 Headache: Secondary | ICD-10-CM | POA: Diagnosis present

## 2016-04-02 DIAGNOSIS — R519 Headache, unspecified: Secondary | ICD-10-CM

## 2016-04-02 LAB — BASIC METABOLIC PANEL
Anion gap: 6 (ref 5–15)
BUN: 7 mg/dL (ref 6–20)
CALCIUM: 9.2 mg/dL (ref 8.9–10.3)
CHLORIDE: 104 mmol/L (ref 101–111)
CO2: 25 mmol/L (ref 22–32)
CREATININE: 0.8 mg/dL (ref 0.44–1.00)
GFR calc non Af Amer: >60 mL/min (ref >60)
GLUCOSE: 87 mg/dL (ref 65–99)
Potassium: 4.4 mmol/L (ref 3.5–5.1)
Sodium: 135 mmol/L (ref 135–145)

## 2016-04-02 LAB — CBC WITH DIFFERENTIAL/PLATELET
BASOS ABS: 0 10*3/uL (ref 0.0–0.1)
BASOS PCT: 0 %
EOS PCT: 2 %
Eosinophils Absolute: 0.1 10*3/uL (ref 0.0–0.7)
HCT: 42.5 % (ref 36.0–46.0)
Hemoglobin: 13.5 g/dL (ref 12.0–15.0)
LYMPHS PCT: 45 %
Lymphs Abs: 3.5 10*3/uL (ref 0.7–4.0)
MCH: 26.9 pg (ref 26.0–34.0)
MCHC: 31.8 g/dL (ref 30.0–36.0)
MCV: 84.7 fL (ref 78.0–100.0)
MONO ABS: 0.7 10*3/uL (ref 0.1–1.0)
Monocytes Relative: 8 %
Neutro Abs: 3.6 10*3/uL (ref 1.7–7.7)
Neutrophils Relative %: 45 %
PLATELETS: 318 10*3/uL (ref 150–400)
RBC: 5.02 MIL/uL (ref 3.87–5.11)
RDW: 13.9 % (ref 11.5–15.5)
WBC: 7.9 10*3/uL (ref 4.0–10.5)

## 2016-04-02 LAB — TSH: TSH: 2.795 u[IU]/mL (ref 0.350–4.500)

## 2016-04-02 LAB — I-STAT BETA HCG BLOOD, ED (MC, WL, AP ONLY)

## 2016-04-02 MED ORDER — DIPHENHYDRAMINE HCL 50 MG/ML IJ SOLN
25.0000 mg | Freq: Once | INTRAMUSCULAR | Status: AC
  Administered 2016-04-02: 25 mg via INTRAVENOUS
  Filled 2016-04-02: qty 1

## 2016-04-02 MED ORDER — SODIUM CHLORIDE 0.9 % IV BOLUS (SEPSIS)
1000.0000 mL | Freq: Once | INTRAVENOUS | Status: AC
  Administered 2016-04-02: 1000 mL via INTRAVENOUS

## 2016-04-02 MED ORDER — DEXAMETHASONE SODIUM PHOSPHATE 10 MG/ML IJ SOLN
10.0000 mg | Freq: Once | INTRAMUSCULAR | Status: AC
  Administered 2016-04-02: 10 mg via INTRAVENOUS
  Filled 2016-04-02: qty 1

## 2016-04-02 MED ORDER — METOCLOPRAMIDE HCL 10 MG PO TABS
10.0000 mg | ORAL_TABLET | Freq: Once | ORAL | Status: AC
  Administered 2016-04-02: 10 mg via ORAL
  Filled 2016-04-02: qty 1

## 2016-04-02 NOTE — Discharge Instructions (Signed)
Our social worker has spoken about establishing care with a primary doctor.  Please follow up with that information.

## 2016-04-02 NOTE — ED Notes (Signed)
Pt understood dc material. NAD noted. 

## 2016-04-02 NOTE — ED Triage Notes (Signed)
Pt reports to the ED for eval of HTN. Pt has hx of same and was placed on BP medications but she does not have a PCP so she has not been able to get her medications adjusted. Pt reports she has had decreased PO intake and HAs x 1 month. Has worse with light. She also has some associated nausea. She is still currently taking her BP medications but she just has not been able to get her medications adjusted. Pt A&Ox4, resp e/u, and skin warm and dry.

## 2016-04-02 NOTE — ED Provider Notes (Signed)
[facility]-EMERGENCY DEPT Provider Note   CSN: [id number] Arrival date & time: 04/02/16  1550  First Provider Contact:  None       History   Chief Complaint Chief Complaint  Patient presents with  . Hypertension    HPI Cindy Wang is a 35 y.o. female.  HPI  Patient presents with complaint of headache and fatigue. She reports she has a history of hypertension, does been on meds for a while. For a couple months she has been feeling generally fatigued, not herself, and had intermittent frontal headaches. She describes increased rigidity of her headaches in the past month. She thinks is related to her blood pressure, however does endorse compliance with her medicines. She denies any falls, fevers, chills. She also endorses some decreased oral intake, due to poor taste of food, and occasional nausea that usually associate with headaches.  Past Medical History:  Diagnosis Date  . Ectopic pregnancy   . Hypertension     There are no active problems to display for this patient.   Past Surgical History:  Procedure Laterality Date  . TUBAL LIGATION Bilateral     OB History    Gravida Para Term Preterm AB Living   4 3 3     3    SAB TAB Ectopic Multiple Live Births           3       Home Medications    Prior to Admission medications   Medication Sig Start Date End Date Taking? Authorizing Provider  acetaminophen (TYLENOL) 500 MG tablet Take 1,000 mg by mouth every 6 (six) hours as needed for mild pain or headache.    Historical Provider, MD  carvedilol (COREG) 6.25 MG tablet Take 1 tablet (6.25 mg total) by mouth 2 (two) times daily with a meal. 03/26/16 06/24/16  Brock Badharles A Harper, MD  hydrochlorothiazide (MICROZIDE) 12.5 MG capsule Take 1 capsule (12.5 mg total) by mouth daily. 07/03/15   Brock Badharles A Harper, MD  HYDROcodone-acetaminophen (NORCO/VICODIN) 5-325 MG tablet Take 1 tablet by mouth every 6 (six) hours as needed. 10/04/15   Roxy Horsemanobert Browning, PA-C  levonorgestrel-ethinyl  estradiol (NORDETTE) 0.15-30 MG-MCG tablet Take 1 tablet by mouth daily. 07/04/15   Brock Badharles A Harper, MD  oxyCODONE-acetaminophen (PERCOCET/ROXICET) 5-325 MG tablet Take 2 tablets by mouth every 4 (four) hours as needed for severe pain. Patient not taking: Reported on 07/04/2015 06/20/15   Jean RosenthalSusan P Lineberry, NP    Family History No family history on file.  Social History Social History  Substance Use Topics  . Smoking status: Former Smoker    Types: Cigarettes  . Smokeless tobacco: Never Used  . Alcohol use No     Allergies   Review of patient's allergies indicates no known allergies.   Review of Systems Review of Systems  Constitutional: Positive for fatigue. Negative for chills and fever.  HENT: Negative for ear pain and sore throat.   Eyes: Negative for pain and visual disturbance.  Respiratory: Negative for cough and shortness of breath.   Cardiovascular: Negative for chest pain and palpitations.  Gastrointestinal: Positive for nausea. Negative for abdominal pain and vomiting.  Genitourinary: Negative for dysuria and hematuria.  Musculoskeletal: Negative for arthralgias and back pain.  Skin: Negative for color change and rash.  Neurological: Positive for headaches. Negative for seizures and syncope.  All other systems reviewed and are negative.    Physical Exam Updated Vital Signs BP 135/97   Pulse 65   Temp 98.4 F (36.9 C) (  Oral)   Resp 20   Ht 5' (1.524 m)   Wt 80.3 kg   LMP 05/03/2015   SpO2 100%   BMI 34.57 kg/m   Physical Exam  Constitutional: She appears well-developed and well-nourished. No distress.  HENT:  Head: Normocephalic and atraumatic.  Eyes: Conjunctivae are normal.  Neck: Neck supple.  Cardiovascular: Normal rate and regular rhythm.   No murmur heard. Pulmonary/Chest: Effort normal and breath sounds normal. No respiratory distress.  Abdominal: Soft. There is no tenderness.  Musculoskeletal: She exhibits no edema.  Neurological: She is  alert.  Alert and oriented CN II-XII grossly intact Eyes: PERRL, EOMI Bilateral UE strength 5/5 Bilateral LE strength 5/5 Intact finger-to-nose Intact gait   Skin: Skin is warm and dry.  Psychiatric: She has a normal mood and affect.  Nursing note and vitals reviewed.    ED Treatments / Results  Labs (all labs ordered are listed, but only abnormal results are displayed) Labs Reviewed - No data to display  EKG  EKG Interpretation None       Radiology No results found.  Procedures Procedures (including critical care time)  Medications Ordered in ED Medications - No data to display   Initial Impression / Assessment and Plan / ED Course  I have reviewed the triage vital signs and the nursing notes.  Pertinent labs & imaging results that were available during my care of the patient were reviewed by me and considered in my medical decision making (see chart for details).  Clinical Course   Regarding headache, this appears to be indolent and not worst of life, she has a normal neuro exam, and no signs of meningitis/encephalitis. Headache resolved with migraine cocktail. For fatigue, she has no symptoms of infection. No leukocytosis or anemia. HCG negative. I spoke with SW to help her establish care with a PCP for further care. She was stable at time of discharge.    Final Clinical Impressions(s) / ED Diagnoses   Final diagnoses:  None    New Prescriptions New Prescriptions   No medications on file     Marcelina Morel, MD 04/03/16 0144    Pricilla Loveless, MD 04/04/16 [phone]

## 2016-04-02 NOTE — Care Management Note (Signed)
Case Management Note  Patient Details  Name: Harrington ChallengerCrystal A Riches MRN: [id number] Date of Birth: 01-Jun-1981  Subjective/Objective:                  HTN  Action/Plan: Cm spoke to patient at the bedside about the need for PCP. She has active Medicaid but has not received her card yet. CM advised that she can call the Medicaid office and request that a new card be sent and to ask what PCP was assigned and if one was not, she can request it. Cm also gave [facility] and advised that she can follow up there. She said that [facility]. Cm remains available should additional needs arise and patient denied further needs.  Expected Discharge Date:  04/02/16              Expected Discharge Plan:  Home/Self Care  In-House Referral:     Discharge planning Services  CM Consult, [facility]  Post Acute Care Choice:    Choice offered to:  Patient  DME Arranged:    DME Agency:     HH Arranged:    HH Agency:     Status of Service:  Completed, signed off  If discussed at [employer] Meetings, dates discussed:    Additional Comments:  Darcel SmallingAnna C Azaela Caracci, RN 04/02/2016, 8:19 PM

## 2016-06-26 ENCOUNTER — Other Ambulatory Visit: Payer: Self-pay | Admitting: Obstetrics

## 2016-07-16 ENCOUNTER — Ambulatory Visit: Payer: PRIVATE HEALTH INSURANCE | Admitting: Family Medicine

## 2016-07-24 ENCOUNTER — Ambulatory Visit: Payer: PRIVATE HEALTH INSURANCE | Admitting: Family Medicine

## 2016-08-23 ENCOUNTER — Ambulatory Visit: Payer: PRIVATE HEALTH INSURANCE | Admitting: Family Medicine

## 2016-08-29 ENCOUNTER — Other Ambulatory Visit (HOSPITAL_COMMUNITY)
Admission: RE | Admit: 2016-08-29 | Discharge: 2016-08-29 | Disposition: A | Discharge status: Home / Self Care | Payer: Medicaid Other | Source: Ambulatory Visit | Attending: Family Medicine | Admitting: Family Medicine

## 2016-08-29 ENCOUNTER — Ambulatory Visit: Payer: Medicaid Other | Attending: Family Medicine | Admitting: Family Medicine

## 2016-08-29 ENCOUNTER — Encounter: Payer: Self-pay | Admitting: Family Medicine

## 2016-08-29 VITALS — BP 132/89 | HR 74 | Temp 98.7°F | Resp 18 | Ht 60.0 in | Wt 177.0 lb

## 2016-08-29 DIAGNOSIS — Z9851 Tubal ligation status: Secondary | ICD-10-CM | POA: Insufficient documentation

## 2016-08-29 DIAGNOSIS — N926 Irregular menstruation, unspecified: Secondary | ICD-10-CM | POA: Insufficient documentation

## 2016-08-29 DIAGNOSIS — N83202 Unspecified ovarian cyst, left side: Secondary | ICD-10-CM | POA: Diagnosis not present

## 2016-08-29 DIAGNOSIS — N946 Dysmenorrhea, unspecified: Secondary | ICD-10-CM | POA: Diagnosis not present

## 2016-08-29 DIAGNOSIS — R1030 Lower abdominal pain, unspecified: Secondary | ICD-10-CM | POA: Insufficient documentation

## 2016-08-29 DIAGNOSIS — I10 Essential (primary) hypertension: Secondary | ICD-10-CM | POA: Insufficient documentation

## 2016-08-29 LAB — CBC WITH DIFFERENTIAL/PLATELET
BASOS PCT: 0 %
Basophils Absolute: 0 cells/uL (ref 0–200)
EOS PCT: 3 %
Eosinophils Absolute: 237 cells/uL (ref 15–500)
HCT: 42.2 % (ref 35.0–45.0)
Hemoglobin: 13.5 g/dL (ref 11.7–15.5)
LYMPHS PCT: 42 %
Lymphs Abs: 3318 cells/uL (ref 850–3900)
MCH: 26.8 pg — ABNORMAL LOW (ref 27.0–33.0)
MCHC: 32 g/dL (ref 32.0–36.0)
MCV: 83.9 fL (ref 80.0–100.0)
MONOS PCT: 9 %
MPV: 9.8 fL (ref 7.5–12.5)
Monocytes Absolute: 711 cells/uL (ref 200–950)
NEUTROS ABS: 3634 {cells}/uL (ref 1500–7800)
Neutrophils Relative %: 46 %
PLATELETS: 354 10*3/uL (ref 140–400)
RBC: 5.03 MIL/uL (ref 3.80–5.10)
RDW: 14.5 % (ref 11.0–15.0)
WBC: 7.9 10*3/uL (ref 3.8–10.8)

## 2016-08-29 LAB — BASIC METABOLIC PANEL
BUN: 10 mg/dL (ref 7–25)
CALCIUM: 9.5 mg/dL (ref 8.6–10.2)
CO2: 26 mmol/L (ref 20–31)
Chloride: 104 mmol/L (ref 98–110)
Creat: 0.92 mg/dL (ref 0.50–1.10)
Glucose, Bld: 87 mg/dL (ref 65–99)
Potassium: 4.4 mmol/L (ref 3.5–5.3)
Sodium: 140 mmol/L (ref 135–146)

## 2016-08-29 LAB — POCT URINE PREGNANCY: PREG TEST UR: NEGATIVE

## 2016-08-29 MED ORDER — HYDROCHLOROTHIAZIDE 12.5 MG PO CAPS: 12.5000 mg | ORAL_CAPSULE | Freq: Every day | ORAL | 2 refills | Status: DC

## 2016-08-29 MED ORDER — CARVEDILOL 6.25 MG PO TABS: 6.2500 mg | ORAL_TABLET | Freq: Two times a day (BID) | ORAL | 2 refills | Status: DC

## 2016-08-29 NOTE — Patient Instructions (Signed)
Dysmenorrhea Dysmenorrhea is pain during a menstrual period. You will have pain in the lower belly (abdomen). The pain is caused by the tightening (contracting) of the muscles of the uterus. The pain can be minor or severe. Headache, feeling sick to your stomach (nausea), throwing up (vomiting), or low back pain may occur with this condition. Follow these instructions at home:  Only take medicine as told by your doctor.  Place a heating pad or hot water bottle on your lower back or belly. Do not sleep with a heating pad.  Exercise may help lessen the pain.  Massage the lower back or belly.  Stop smoking.  Avoid alcohol and caffeine. Contact a doctor if:  Your pain does not get better with medicine.  You have pain during sex.  Your pain gets worse while taking pain medicine.  Your period bleeding is heavier than normal.  You keep feeling sick to your stomach or keep throwing up. Get help right away if:  You pass out (faint). This information is not intended to replace advice given to you by your health care provider. Make sure you discuss any questions you have with your health care provider. Document Released: 11/08/2008 Document Revised: 01/18/2016 Document Reviewed: 01/28/2013 Elsevier Interactive Patient Education  2017 Elsevier Inc.  CT Scan A CT scan is a kind of X-ray that makes pictures of parts of your body. It is a painless way for your doctor to see inside your body. The CT scanner is a large machine that has an opening. Your body moves through the opening so that pictures can be taken. BEFORE THE PROCEDURE  The day before the test, do not have drinks with caffeine. Drinks with caffeine include energy drinks, tea, soda, coffee, and hot chocolate.  On the day of the test:  Do not eat at least 4 hours before the test or as told by your doctor.  Do not drink anything but water at least 4 hours before the test or as told by your doctor.  Leave your jewelry at home.  You will have to take off some or all of your clothing. Your doctor will give you a hospital gown to wear. PROCEDURE   You will lie on a table with your arms above your head.  An IV tube may be put in your arm. If this is done, liquid will be put through the tube. It may make you feel warm or give you a metal taste in your mouth.  The table you will be lying on will move into a large machine.  You will be able to see, hear, and talk to the person running the machine while you are in it. Follow that person's directions.  The machine will move around you to take pictures. Do not move.  When the machine is done taking pictures it will be turned off.  The table will be moved out of the machine.  If you had an IV tube put in, it will be removed. AFTER THE PROCEDURE Ask your doctor when to call or come in for your test results. This information is not intended to replace advice given to you by your health care provider. Make sure you discuss any questions you have with your health care provider. Document Released: 11/08/2008 Document Revised: 08/17/2013 Document Reviewed: 04/28/2013 Elsevier Interactive Patient Education  2017 [employer]

## 2016-08-29 NOTE — Progress Notes (Signed)
Patient is here to establish care. Patient is here for HTN  Patient denies pain at this time.  Patient complains of abdominal pain during menstruation. Patient states the first 3 days she is in excruciating pain and is unable to turn from side to side.  Patient has taken medication today. Patient has eaten today.

## 2016-08-29 NOTE — Progress Notes (Signed)
Subjective:  Patient ID: Cindy Wang, female    DOB: Jan 30, 1981  Age: 36 y.o. MRN: [id number]  CC: Hypertension  HPI Cindy Wang presents for HTN. She also c/o abdominal pain 7/10 during menstrual cycles. Reports poor appetite, nausea, and cramping with irregular menstrual periods. Cycle lengths of two days, with c/o dried menstrual blood prior to start of menstrual flow. LPM 08/14/2016. Reports PMH of tubal ligation in 2010. Reports irregular periods and hypertension since history of ectopic pregnancy in October 2016. Also reports history of left ovarian cyst in 2016. Denies any excessive hair growth.Abdominal pain during menstrual cycles is exacerbated by turning at the waist, pain 10/10. Takes tylenol & aleve with no relief of symptoms.  Outpatient Medications Prior to Visit  Medication Sig Dispense Refill  . acetaminophen (TYLENOL) 500 MG tablet Take 1,000 mg by mouth every 6 (six) hours as needed for mild pain or headache.    . carvedilol (COREG) 6.25 MG tablet Take 1 tablet (6.25 mg total) by mouth 2 (two) times daily with a meal. 60 tablet 2  . hydrochlorothiazide (MICROZIDE) 12.5 MG capsule Take 1 capsule (12.5 mg total) by mouth daily. 60 capsule 1  . HYDROcodone-acetaminophen (NORCO/VICODIN) 5-325 MG tablet Take 1 tablet by mouth every 6 (six) hours as needed. (Patient not taking: Reported on 08/29/2016) 6 tablet 0  . levonorgestrel-ethinyl estradiol (NORDETTE) 0.15-30 MG-MCG tablet Take 1 tablet by mouth daily. (Patient not taking: Reported on 08/29/2016) 1 Package 11  . oxyCODONE-acetaminophen (PERCOCET/ROXICET) 5-325 MG tablet Take 2 tablets by mouth every 4 (four) hours as needed for severe pain. (Patient not taking: Reported on 08/29/2016) 15 tablet 0   No facility-administered medications prior to visit.     ROS Review of Systems  Respiratory: Negative.   Cardiovascular: Negative.   Gastrointestinal: Abdominal pain: reports bilateral lower abdomial pain 7/10 when  menstrating.   Genitourinary: Positive for menstrual problem. Pelvic pain: during menstrual cycle.   Objective:  BP 132/89 (BP Location: Left Arm, Patient Position: Sitting, Cuff Size: Large)   Pulse 74   Temp 98.7 F (37.1 C) (Oral)   Resp 18   Ht 5' (1.524 m)   Wt 177 lb (80.3 kg)   LMP 08/14/2016   SpO2 99%   BMI 34.57 kg/m   BP/Weight 08/29/2016 04/02/2016 10/04/2015  Systolic BP 132 124 153  Diastolic BP 89 95 107  Wt. (Lbs) 177 177 -  BMI 34.57 34.57 -   Physical Exam  Constitutional: She is oriented to person, place, and time. She appears well-developed and well-nourished.  Cardiovascular: Normal rate, regular rhythm, normal heart sounds and intact distal pulses.   Pulmonary/Chest: Effort normal and breath sounds normal.  Abdominal: Soft. Bowel sounds are normal. There is no tenderness.  Neurological: She is alert and oriented to person, place, and time.  Skin: Skin is warm and dry.    Assessment & Plan:   Problem List Items Addressed This Visit      Cardiovascular and Mediastinum   Hypertension   Relevant Medications   hydrochlorothiazide (MICROZIDE) 12.5 MG capsule   carvedilol (COREG) 6.25 MG tablet    Other Visit Diagnoses    Dysmenorrhea    -  Primary   Relevant Orders   Ambulatory referral to Gynecology   POCT urine pregnancy (Completed)   Lower abdominal pain of unknown etiology       Relevant Orders   CT Abdomen Pelvis W Contrast   Basic Metabolic Panel   CBC with Differential  POCT urine pregnancy (Completed)   Urine cytology ancillary only   Irregular menstrual cycle       Relevant Orders   Ambulatory referral to Gynecology   POCT urine pregnancy (Completed)     Meds ordered this encounter  Medications  . hydrochlorothiazide (MICROZIDE) 12.5 MG capsule    Sig: Take 1 capsule (12.5 mg total) by mouth daily.    Dispense:  30 capsule    Refill:  2    Order Specific Question:   Supervising Provider    Answer:   Quentin Angst [id number]    . carvedilol (COREG) 6.25 MG tablet    Sig: Take 1 tablet (6.25 mg total) by mouth 2 (two) times daily with a meal.    Dispense:  60 tablet    Refill:  2    Order Specific Question:   Supervising Provider    Answer:   Quentin Angst [[id number]]      Lizbeth Bark FNP

## 2016-08-30 LAB — URINE CYTOLOGY ANCILLARY ONLY
CHLAMYDIA, DNA PROBE: NEGATIVE
NEISSERIA GONORRHEA: NEGATIVE
TRICH (WINDOWPATH): NEGATIVE

## 2016-09-02 LAB — URINE CYTOLOGY ANCILLARY ONLY
BACTERIAL VAGINITIS: NEGATIVE
CANDIDA VAGINITIS: NEGATIVE

## 2016-09-03 ENCOUNTER — Telehealth: Payer: Self-pay

## 2016-09-03 NOTE — Telephone Encounter (Signed)
-----   Message from Lizbeth BarkMandesia R Hairston, FNP sent at 09/03/2016  4:54 AM EST ----- -Labs normal. -Urine pregnancy negative. -Chlamydia, gonorrhea, and trichomonas all negative.

## 2016-09-03 NOTE — Telephone Encounter (Signed)
Patient Verify DOB  Patient was aware and understood that her lab results were normal and all her stds results were also negative  Patient did not have any further questions

## 2016-09-05 ENCOUNTER — Telehealth: Payer: Self-pay

## 2016-09-05 NOTE — Telephone Encounter (Signed)
PATIENT VERIFY DOB  PATIENT WAS AWARE AND UNDERSTOOD THAT HER YEAST & BV WERE NEGATIVE  PATIENT DID NOT HAVE ANY FURTHER QUESTIONS

## 2016-09-05 NOTE — Telephone Encounter (Signed)
-----   Message from Lizbeth BarkMandesia R Hairston, FNP sent at 09/05/2016  6:06 AM EST ----- Negative for BV and yeast infetion.

## 2016-09-09 ENCOUNTER — Ambulatory Visit (HOSPITAL_COMMUNITY)
Admission: RE | Admit: 2016-09-09 | Discharge: 2016-09-09 | Disposition: A | Discharge status: Home / Self Care | Payer: Medicaid Other | Source: Ambulatory Visit | Attending: Family Medicine | Admitting: Family Medicine

## 2016-09-09 ENCOUNTER — Encounter (HOSPITAL_COMMUNITY): Payer: Self-pay

## 2016-09-09 DIAGNOSIS — R1030 Lower abdominal pain, unspecified: Secondary | ICD-10-CM | POA: Diagnosis not present

## 2016-09-09 MED ORDER — IOPAMIDOL (ISOVUE-300) INJECTION 61%
INTRAVENOUS | Status: AC
  Filled 2016-09-09: qty 100

## 2016-09-09 MED ORDER — IOPAMIDOL (ISOVUE-300) INJECTION 61%
100.0000 mL | Freq: Once | INTRAVENOUS | Status: AC | PRN
  Administered 2016-09-09: 100 mL via INTRAVENOUS

## 2016-09-16 ENCOUNTER — Telehealth: Payer: Self-pay | Admitting: Family Medicine

## 2016-09-16 ENCOUNTER — Other Ambulatory Visit: Payer: Self-pay | Admitting: Family Medicine

## 2016-09-16 MED ORDER — IBUPROFEN 800 MG PO TABS: 800.0000 mg | ORAL_TABLET | Freq: Three times a day (TID) | ORAL | 2 refills | Status: DC | PRN

## 2016-09-16 NOTE — Telephone Encounter (Signed)
Patient Verify DOB  Patient was aware and understood her results 

## 2016-09-16 NOTE — Telephone Encounter (Signed)
-----   Message from Lizbeth BarkMandesia R Hairston, FNP sent at 09/13/2016  1:32 PM EST ----- - Abdominal CT imaging was normal.  -Please follow up with your gynecology referral.

## 2016-09-16 NOTE — Progress Notes (Signed)
Patient said yes if you can prescribe something for her cramps because she already tried over the counter and it does not work.

## 2016-09-16 NOTE — Telephone Encounter (Signed)
Pt. Called stating that she is having abdominal pain and her menstrual cycle started. Pt. States it was 3 days late. Pt. Also stated that her PCP stated to call if she was having pain again. Please f/u

## 2016-09-17 NOTE — Telephone Encounter (Signed)
CMA call to inform patient that her pcp prescribe her some ibuprofen to help her cramps

## 2016-09-17 NOTE — Telephone Encounter (Signed)
-----   Message from Lizbeth BarkMandesia R Hairston, [place] sent at 09/16/2016  6:06 PM EST ----- Ibuprofen has been prescribed for cramps.

## 2016-10-22 ENCOUNTER — Emergency Department (HOSPITAL_COMMUNITY)
Admission: EM | Admit: 2016-10-22 | Discharge: 2016-10-22 | Disposition: A | Discharge status: Home / Self Care | Payer: Medicaid Other | Attending: Emergency Medicine | Admitting: Emergency Medicine

## 2016-10-22 ENCOUNTER — Encounter (HOSPITAL_COMMUNITY): Payer: Self-pay | Admitting: Emergency Medicine

## 2016-10-22 ENCOUNTER — Emergency Department (HOSPITAL_COMMUNITY): Payer: Medicaid Other

## 2016-10-22 DIAGNOSIS — Y929 Unspecified place or not applicable: Secondary | ICD-10-CM | POA: Diagnosis not present

## 2016-10-22 DIAGNOSIS — Z87891 Personal history of nicotine dependence: Secondary | ICD-10-CM | POA: Insufficient documentation

## 2016-10-22 DIAGNOSIS — W108XXA Fall (on) (from) other stairs and steps, initial encounter: Secondary | ICD-10-CM | POA: Diagnosis not present

## 2016-10-22 DIAGNOSIS — S0181XA Laceration without foreign body of other part of head, initial encounter: Secondary | ICD-10-CM

## 2016-10-22 DIAGNOSIS — Y939 Activity, unspecified: Secondary | ICD-10-CM | POA: Insufficient documentation

## 2016-10-22 DIAGNOSIS — S0083XA Contusion of other part of head, initial encounter: Secondary | ICD-10-CM

## 2016-10-22 DIAGNOSIS — I10 Essential (primary) hypertension: Secondary | ICD-10-CM | POA: Diagnosis not present

## 2016-10-22 DIAGNOSIS — Y999 Unspecified external cause status: Secondary | ICD-10-CM | POA: Diagnosis not present

## 2016-10-22 DIAGNOSIS — Z79899 Other long term (current) drug therapy: Secondary | ICD-10-CM | POA: Diagnosis not present

## 2016-10-22 DIAGNOSIS — S0990XA Unspecified injury of head, initial encounter: Secondary | ICD-10-CM | POA: Diagnosis present

## 2016-10-22 MED ORDER — AMOXICILLIN-POT CLAVULANATE 875-125 MG PO TABS: 1.0000 | ORAL_TABLET | Freq: Two times a day (BID) | ORAL | 0 refills | Status: DC

## 2016-10-22 MED ORDER — IBUPROFEN 800 MG PO TABS: 800.0000 mg | ORAL_TABLET | Freq: Three times a day (TID) | ORAL | 0 refills | Status: DC

## 2016-10-22 MED ORDER — FENTANYL CITRATE (PF) 100 MCG/2ML IJ SOLN
50.0000 ug | Freq: Once | INTRAMUSCULAR | Status: AC
  Administered 2016-10-22: 50 ug via INTRAVENOUS
  Filled 2016-10-22: qty 2

## 2016-10-22 NOTE — Discharge Instructions (Signed)
Use the antiinflammatory as prescribed. Apply ice to your face to reduce the swelling. Follow up with your primary doctor. Eat a soft diet over the next several days. Return to the ED if you develop new or worsening symptoms.

## 2016-10-22 NOTE — ED Notes (Signed)
ED Provider at bedside. 

## 2016-10-22 NOTE — ED Notes (Signed)
dermabond bedside for provider along w/ beverage for pt.

## 2016-10-22 NOTE — ED Provider Notes (Signed)
[facility]-EMERGENCY DEPT Provider Note   CSN: [id number] Arrival date & time: 10/22/16  0423     History   Chief Complaint Chief Complaint  Patient presents with  . Fall  . Facial Injury    HPI Bellarose A Lathon is a 36 y.o. female.   Patient presents with left facial pain after falling against a step while taking her dog outside. States she tripped and fell against a set of wooden steps injuring her left cheek and left mouth. Denies loss of consciousness. She has difficulty speaking and difficulty opening her mouth. Does not take any blood thinners. Denies any neck pain, back pain, chest pain or abdominal pain. Denies any loose teeth. Denies any difficulty breathing or difficulty swallowing.   The history is provided by the patient.  Fall  Associated symptoms include headaches. Pertinent negatives include no chest pain, no abdominal pain and no shortness of breath.  Facial Injury  Associated symptoms: headaches   Associated symptoms: no congestion, no nausea, no neck pain, no rhinorrhea and no vomiting     Past Medical History:  Diagnosis Date  . Ectopic pregnancy   . Hypertension     Patient Active Problem List   Diagnosis Date Noted  . Hypertension 08/29/2016    Past Surgical History:  Procedure Laterality Date  . TUBAL LIGATION Bilateral     OB History    Gravida Para Term Preterm AB Living   4 3 3     3    SAB TAB Ectopic Multiple Live Births           3       Home Medications    Prior to Admission medications   Medication Sig Start Date End Date Taking? Authorizing Provider  acetaminophen (TYLENOL) 500 MG tablet Take 1,000 mg by mouth every 6 (six) hours as needed for mild pain or headache.    Historical Provider, MD  carvedilol (COREG) 6.25 MG tablet Take 1 tablet (6.25 mg total) by mouth 2 (two) times daily with a meal. 03/26/16 08/29/16  Brock Bad, MD  carvedilol (COREG) 6.25 MG tablet Take 1 tablet (6.25 mg total) by mouth 2 (two) times daily with a  meal. 08/29/16   Lizbeth Bark, FNP  hydrochlorothiazide (MICROZIDE) 12.5 MG capsule Take 1 capsule (12.5 mg total) by mouth daily. 07/03/15   Brock Bad, MD  hydrochlorothiazide (MICROZIDE) 12.5 MG capsule Take 1 capsule (12.5 mg total) by mouth daily. 08/29/16   Lizbeth Bark, FNP  HYDROcodone-acetaminophen (NORCO/VICODIN) 5-325 MG tablet Take 1 tablet by mouth every 6 (six) hours as needed. Patient not taking: Reported on 08/29/2016 10/04/15   Roxy Horseman, PA-C  ibuprofen (ADVIL,MOTRIN) 800 MG tablet Take 1 tablet (800 mg total) by mouth every 8 (eight) hours as needed for mild pain, moderate pain or cramping (Take with food.). 09/16/16   Lizbeth Bark, FNP  levonorgestrel-ethinyl estradiol (NORDETTE) 0.15-30 MG-MCG tablet Take 1 tablet by mouth daily. Patient not taking: Reported on 08/29/2016 07/04/15   Brock Bad, MD  Multiple Vitamins-Calcium (ONE-A-DAY WOMENS PO) Take 1 tablet by mouth daily.    Historical Provider, MD  oxyCODONE-acetaminophen (PERCOCET/ROXICET) 5-325 MG tablet Take 2 tablets by mouth every 4 (four) hours as needed for severe pain. Patient not taking: Reported on 08/29/2016 06/20/15   Jean Rosenthal, NP    Family History History reviewed. No pertinent family history.  Social History Social History  Substance Use Topics  . Smoking status: Former Smoker  Types: Cigarettes  . Smokeless tobacco: Never Used  . Alcohol use No     Allergies   Patient has no known allergies.   Review of Systems Review of Systems  Constitutional: Negative for activity change, appetite change and fever.  HENT: Positive for facial swelling and mouth sores. Negative for congestion and rhinorrhea.   Respiratory: Negative for cough, chest tightness and shortness of breath.   Cardiovascular: Negative for chest pain.  Gastrointestinal: Negative for abdominal pain, nausea and vomiting.  Genitourinary: Negative for dysuria, hematuria, vaginal bleeding and vaginal  discharge.  Musculoskeletal: Positive for arthralgias and myalgias. Negative for back pain, neck pain and neck stiffness.  Skin: Negative for rash.  Neurological: Positive for headaches. Negative for dizziness and weakness.  A complete 10 system review of systems was obtained and all systems are negative except as noted in the HPI and PMH.     Physical Exam Updated Vital Signs BP (!) 157/109 (BP Location: Right Arm)   Pulse 110   Temp 98.3 F (36.8 C) (Oral)   Resp 16   Ht 5' (1.524 m)   Wt 177 lb (80.3 kg)   LMP 10/19/2016 (Exact Date)   SpO2 100%   BMI 34.57 kg/m   Physical Exam  Constitutional: She is oriented to person, place, and time. She appears well-developed and well-nourished. No distress.  HENT:  Head: Normocephalic and atraumatic.  Mouth/Throat: Oropharynx is clear and moist. No oropharyngeal exudate.  No septal hematoma, no hemotympanum. There is swelling to left zygoma and left jaw. Patient with difficulty speaking and difficulty opening up mouth. No loose teeth. There is a 2 cm transverse laceration along the mucosal surface of the left buccal mucosa There is a pinpoint area of left upper lip which may be consistent with through and through laceration  Eyes: Conjunctivae and EOM are normal. Pupils are equal, round, and reactive to light.  Neck: Normal range of motion. Neck supple.  NO C spine tenderness  Cardiovascular: Normal rate, regular rhythm, normal heart sounds and intact distal pulses.   No murmur heard. Pulmonary/Chest: Effort normal and breath sounds normal. No respiratory distress.  Abdominal: Soft. There is no tenderness. There is no rebound and no guarding.  Musculoskeletal: Normal range of motion. She exhibits no edema or tenderness.  Neurological: She is alert and oriented to person, place, and time. No cranial nerve deficit. She exhibits normal muscle tone. Coordination normal.  No ataxia on finger to nose bilaterally. No pronator drift. 5/5  strength throughout. CN 2-12 intact.Equal grip strength. Sensation intact.   Skin: Skin is warm.  Psychiatric: She has a normal mood and affect. Her behavior is normal.  Nursing note and vitals reviewed.    ED Treatments / Results  Labs (all labs ordered are listed, but only abnormal results are displayed) Labs Reviewed - No data to display  EKG  EKG Interpretation None       Radiology Ct Head Wo Contrast  Result Date: 10/22/2016 CLINICAL DATA:  Tripped over dog, struck face on stairs. LEFT facial pain, unable to open mouth. EXAM: CT HEAD WITHOUT CONTRAST CT MAXILLOFACIAL WITHOUT CONTRAST CT CERVICAL SPINE WITHOUT CONTRAST TECHNIQUE: Multidetector CT imaging of the head, cervical spine, and maxillofacial structures were performed using the standard protocol without intravenous contrast. Multiplanar CT image reconstructions of the cervical spine and maxillofacial structures were also generated. COMPARISON:  None. FINDINGS: CT HEAD FINDINGS BRAIN: The ventricles and sulci are normal. No intraparenchymal hemorrhage, mass effect nor midline shift. No acute  large vascular territory infarcts. No abnormal extra-axial fluid collections. Basal cisterns are patent. VASCULAR: Unremarkable. SKULL/SOFT TISSUES: No skull fracture. No significant soft tissue swelling. OTHER: None. CT MAXILLOFACIAL FINDINGS OSSEOUS: The mandible is intact, the condyles are located. Severe RIGHT, mild LEFT temporomandibular osteoarthrosis. No acute facial fracture. No destructive bony lesions. ORBITS: Ocular globes and orbital contents are normal. SINUSES: Trace maxillary sinus mucosal thickening without air-fluid levels. Nasal septum is midline. Included mastoid air cells are well aerated. SOFT TISSUES: No significant soft tissue swelling. No subcutaneous gas or radiopaque foreign bodies. CT CERVICAL SPINE FINDINGS ALIGNMENT: Cervical vertebral bodies in alignment. Straightened cervical lordosis. SKULL BASE AND VERTEBRAE:  Cervical vertebral bodies and posterior elements are intact. Developmentally nonunited posterior C1 arch. Intervertebral disc heights preserved. No destructive bony lesions. C1-2 articulation maintained. SOFT TISSUES AND SPINAL CANAL: Included prevertebral and paraspinal soft tissues are normal. DISC LEVELS: No significant osseous canal stenosis or neural foraminal narrowing. UPPER CHEST: Lung apices are clear. OTHER: None. IMPRESSION: CT HEAD: Negative. CT MAXILLOFACIAL: No acute facial fracture. 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 CT CERVICAL SPINE: Negative. Electronically Signed   By: Awilda Metroourtnay  Bloomer M.D.   On: 10/22/2016 05:39   Ct Cervical Spine Wo Contrast  Result Date: 10/22/2016 CLINICAL DATA:  Tripped over dog, struck face on stairs. LEFT facial pain, unable to open mouth. EXAM: CT HEAD WITHOUT CONTRAST CT MAXILLOFACIAL WITHOUT CONTRAST CT CERVICAL SPINE WITHOUT CONTRAST TECHNIQUE: Multidetector CT imaging of the head, cervical spine, and maxillofacial structures were performed using the standard protocol without intravenous contrast. Multiplanar CT image reconstructions of the cervical spine and maxillofacial structures were also generated. COMPARISON:  None. FINDINGS: CT HEAD FINDINGS BRAIN: The ventricles and sulci are normal. No intraparenchymal hemorrhage, mass effect nor midline shift. No acute large vascular territory infarcts. No abnormal extra-axial fluid collections. Basal cisterns are patent. VASCULAR: Unremarkable. SKULL/SOFT TISSUES: No skull fracture. No significant soft tissue swelling. OTHER: None. CT MAXILLOFACIAL FINDINGS OSSEOUS: The mandible is intact, the condyles are located. Severe RIGHT, mild LEFT temporomandibular osteoarthrosis. No acute facial fracture. No destructive bony lesions. ORBITS: Ocular globes and orbital contents are normal. SINUSES: Trace maxillary sinus mucosal thickening without air-fluid levels. Nasal septum is midline. Included  mastoid air cells are well aerated. SOFT TISSUES: No significant soft tissue swelling. No subcutaneous gas or radiopaque foreign bodies. CT CERVICAL SPINE FINDINGS ALIGNMENT: Cervical vertebral bodies in alignment. Straightened cervical lordosis. SKULL BASE AND VERTEBRAE: Cervical vertebral bodies and posterior elements are intact. Developmentally nonunited posterior C1 arch. Intervertebral disc heights preserved. No destructive bony lesions. C1-2 articulation maintained. SOFT TISSUES AND SPINAL CANAL: Included prevertebral and paraspinal soft tissues are normal. DISC LEVELS: No significant osseous canal stenosis or neural foraminal narrowing. UPPER CHEST: Lung apices are clear. OTHER: None. IMPRESSION: CT HEAD: Negative. CT MAXILLOFACIAL: No acute facial fracture. 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 CT CERVICAL SPINE: Negative. Electronically Signed   By: Awilda Metroourtnay  Bloomer M.D.   On: 10/22/2016 05:39   Ct Maxillofacial Wo Contrast  Result Date: 10/22/2016 CLINICAL DATA:  Tripped over dog, struck face on stairs. LEFT facial pain, unable to open mouth. EXAM: CT HEAD WITHOUT CONTRAST CT MAXILLOFACIAL WITHOUT CONTRAST CT CERVICAL SPINE WITHOUT CONTRAST TECHNIQUE: Multidetector CT imaging of the head, cervical spine, and maxillofacial structures were performed using the standard protocol without intravenous contrast. Multiplanar CT image reconstructions of the cervical spine and maxillofacial structures were also generated. COMPARISON:  None. FINDINGS: CT HEAD FINDINGS BRAIN: The ventricles and sulci are normal. No  intraparenchymal hemorrhage, mass effect nor midline shift. No acute large vascular territory infarcts. No abnormal extra-axial fluid collections. Basal cisterns are patent. VASCULAR: Unremarkable. SKULL/SOFT TISSUES: No skull fracture. No significant soft tissue swelling. OTHER: None. CT MAXILLOFACIAL FINDINGS OSSEOUS: The mandible is intact, the condyles are located. Severe  RIGHT, mild LEFT temporomandibular osteoarthrosis. No acute facial fracture. No destructive bony lesions. ORBITS: Ocular globes and orbital contents are normal. SINUSES: Trace maxillary sinus mucosal thickening without air-fluid levels. Nasal septum is midline. Included mastoid air cells are well aerated. SOFT TISSUES: No significant soft tissue swelling. No subcutaneous gas or radiopaque foreign bodies. CT CERVICAL SPINE FINDINGS ALIGNMENT: Cervical vertebral bodies in alignment. Straightened cervical lordosis. SKULL BASE AND VERTEBRAE: Cervical vertebral bodies and posterior elements are intact. Developmentally nonunited posterior C1 arch. Intervertebral disc heights preserved. No destructive bony lesions. C1-2 articulation maintained. SOFT TISSUES AND SPINAL CANAL: Included prevertebral and paraspinal soft tissues are normal. DISC LEVELS: No significant osseous canal stenosis or neural foraminal narrowing. UPPER CHEST: Lung apices are clear. OTHER: None. IMPRESSION: CT HEAD: Negative. CT MAXILLOFACIAL: No acute facial fracture. 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 CT CERVICAL SPINE: Negative. Electronically Signed   By: Awilda Metro M.D.   On: 10/22/2016 05:39    Procedures Procedures (including critical care time)  Medications Ordered in ED Medications  fentaNYL (SUBLIMAZE) injection 50 mcg (50 mcg Intravenous Given 10/22/16 0502)     Initial Impression / Assessment and Plan / ED Course  I have reviewed the triage vital signs and the nursing notes.  Pertinent labs & imaging results that were available during my care of the patient were reviewed by me and considered in my medical decision making (see chart for details).    Mechanical fall with left facial injury. No loss of consciousness. Tetanus is up-to-date.  Imaging is negative for facial fracture or other acute traumatic pathology.  On reassessment, patient's dentition is intact. She is able to open her mouth  more fully. Patient tolerating by mouth.  Her intraoral laceration does not appear to need sutures. A punctate lesion to her left upper lip has closed on its own and will not need repair. We'll place her on antibiotics given possibility of through and through lip laceration.  Follow-up with PCP. Recommend soft diet for next several days. Return precautions discussed.  Final Clinical Impressions(s) / ED Diagnoses   Final diagnoses:  Contusion of face, initial encounter  Facial laceration, initial encounter    New Prescriptions New Prescriptions   No medications on file     Glynn Octave, MD 10/22/16 [phone]

## 2016-10-22 NOTE — ED Notes (Signed)
Patient transported to CT 

## 2016-10-22 NOTE — ED Triage Notes (Addendum)
Pt fell while out walking dog. Mechanical fall going up wooden steps to home. Swelling noted to L side of face (cheekbone area) and L side of mouth. Pt has difficulty speaking clearly and opening mouth fully. Blood noted to L top lip. Pt denies LOC.

## 2016-11-14 ENCOUNTER — Encounter: Payer: Medicaid Other | Admitting: Obstetrics and Gynecology

## 2016-11-21 ENCOUNTER — Ambulatory Visit (INDEPENDENT_AMBULATORY_CARE_PROVIDER_SITE_OTHER): Payer: Medicaid Other | Admitting: Obstetrics & Gynecology

## 2016-11-21 ENCOUNTER — Encounter (HOSPITAL_COMMUNITY): Payer: Self-pay

## 2016-11-21 ENCOUNTER — Encounter: Payer: Self-pay | Admitting: Obstetrics & Gynecology

## 2016-11-21 ENCOUNTER — Other Ambulatory Visit (HOSPITAL_COMMUNITY)
Admission: RE | Admit: 2016-11-21 | Discharge: 2016-11-21 | Disposition: A | Discharge status: Home / Self Care | Payer: Medicaid Other | Source: Ambulatory Visit | Attending: Obstetrics & Gynecology | Admitting: Obstetrics & Gynecology

## 2016-11-21 VITALS — BP 117/84 | HR 89 | Wt 176.1 lb

## 2016-11-21 DIAGNOSIS — Z01419 Encounter for gynecological examination (general) (routine) without abnormal findings: Secondary | ICD-10-CM | POA: Insufficient documentation

## 2016-11-21 DIAGNOSIS — Z1151 Encounter for screening for human papillomavirus (HPV): Secondary | ICD-10-CM | POA: Diagnosis not present

## 2016-11-21 DIAGNOSIS — N946 Dysmenorrhea, unspecified: Secondary | ICD-10-CM

## 2016-11-21 DIAGNOSIS — Z0000 Encounter for general adult medical examination without abnormal findings: Secondary | ICD-10-CM

## 2016-11-21 NOTE — Progress Notes (Signed)
   Subjective:    Patient ID: Cindy Wang, female    DOB: [birth year], 36 y.o.   MRN: [id number]  HPI 36 yo M AAP3 (17, 1810, and 447 yo kids) here today with the issue of a difference in her menstrual cycle since her ectopic pregnacy in 2016. They have become very painful, the pain starts a few days before her period and during period and a few days afterward. She has tried IBU 800 but this does not help.     Review of Systems She has had a BTL in 2010.    Objective:   Physical Exam Pleasant [id number] Breathing, conversing, and ambulating normally Abd- benign Normal vulva, vagina, cervix Minimal prolpase and wide pubic arch NSSA, NT, mobile non-enlarged, non-painful adnexa       Assessment & Plan:  Fertility discussed. She is not sure that she wants this fertility, wants to discuss it with her husband. In case she does want this right tube "tied" again, she will sign the medicaid forms today.  With regard to her dysmenorrhea, I cannot prescribed OCPs as she has hypertenstion. So I will rec a diagnostic laparoscopy (info given) She will call when she has made a decision

## 2016-11-21 NOTE — Patient Instructions (Signed)
Diagnostic Laparoscopy, Care After  Refer to this sheet in the next few weeks. These instructions provide you with information about caring for yourself after your procedure. Your health care provider may also give you more specific instructions. Your treatment has been planned according to current medical practices, but problems sometimes occur. Call your health care provider if you have any problems or questions after your procedure.  What can I expect after the procedure?  After your procedure, it is common to have mild discomfort in the throat and abdomen.  Follow these instructions at home:  · Take over-the-counter and prescription medicines only as told by your health care provider.  · Do not drive for 24 hours if you received a sedative.  · Return to your normal activities as told by your health care provider.  ·  Do not take baths, swim, or use a hot tub until your health care provider approves. You may shower.  · Follow instructions from your health care provider about how to take care of your incision. Make sure you:  ? Wash your hands with soap and water before you change your bandage (dressing). If soap and water are not available, use hand sanitizer.  ? Change your dressing as told by your health care provider.  ? Leave stitches (sutures), skin glue, or adhesive strips in place. These skin closures may need to stay in place for 2 weeks or longer. If adhesive strip edges start to loosen and curl up, you may trim the loose edges. Do not remove adhesive strips completely unless your health care provider tells you to do that.  · Check your incision area every day for signs of infection. Check for:  ? More redness, swelling, or pain.  ? More fluid or blood.  ? Warmth.  ? Pus or a bad smell.  · It is your responsibility to get the results of your procedure. Ask your health care provider or the department performing the procedure when your results will be ready.  Contact a health care provider if:  · There is  new pain in your shoulders.  · You feel light-headed or faint.  · You are unable to pass gas or unable to have a bowel movement.  · You feel nauseous or you vomit.  · You develop a rash.  · You have more redness, swelling, or pain around your incision.  · You have more fluid or blood coming from your incision.  · Your incision feels warm to the touch.  · You have pus or a bad smell coming from your incision.  · You have a fever or chills.  Get help right away if:  · Your pain is getting worse.  · You have ongoing vomiting.  · The edges of your incision open up.  · You have trouble breathing.  · You have chest pain.  This information is not intended to replace advice given to you by your health care provider. Make sure you discuss any questions you have with your health care provider.  Document Released: 07/24/2015 Document Revised: 01/18/2016 Document Reviewed: 04/25/2015  Elsevier Interactive Patient Education © 2017 [employer]

## 2016-11-26 LAB — CYTOLOGY - PAP
ADEQUACY: ABSENT
DIAGNOSIS: NEGATIVE
HPV: NOT DETECTED

## 2016-12-13 ENCOUNTER — Other Ambulatory Visit: Payer: Self-pay | Admitting: Family Medicine

## 2016-12-13 DIAGNOSIS — I10 Essential (primary) hypertension: Secondary | ICD-10-CM

## 2016-12-24 ENCOUNTER — Encounter: Payer: Self-pay | Admitting: Obstetrics & Gynecology

## 2017-01-10 ENCOUNTER — Ambulatory Visit: Payer: Medicaid Other | Admitting: Obstetrics & Gynecology

## 2017-01-21 ENCOUNTER — Ambulatory Visit: Admit: 2017-01-21 | Payer: Medicaid Other | Admitting: Obstetrics & Gynecology

## 2017-01-21 SURGERY — LAPAROSCOPY, DIAGNOSTIC
Anesthesia: Choice

## 2017-02-24 ENCOUNTER — Other Ambulatory Visit: Payer: Self-pay | Admitting: Family Medicine

## 2017-02-24 NOTE — Telephone Encounter (Signed)
Pt is now using [facility] on [address] ph [phone]

## 2017-02-24 NOTE — Telephone Encounter (Signed)
I cannot assist with this, will defer to PCP

## 2017-02-24 NOTE — Telephone Encounter (Signed)
Patient called requesting medication refill on  ibuprofen (ADVIL,MOTRIN) 800 MG tablet, pt would like to speak to nurse regarding requesting a stronger medication due to her having "severe" pain. Please f/up

## 2017-02-27 ENCOUNTER — Other Ambulatory Visit: Payer: Self-pay | Admitting: Internal Medicine

## 2017-02-27 ENCOUNTER — Telehealth: Payer: Self-pay

## 2017-02-27 MED ORDER — IBUPROFEN 800 MG PO TABS: 800.0000 mg | ORAL_TABLET | Freq: Three times a day (TID) | ORAL | 0 refills | Status: DC

## 2017-02-27 NOTE — Telephone Encounter (Signed)
Medication sent to the pharmacy [facility]

## 2017-02-27 NOTE — Telephone Encounter (Signed)
CMA call regarding medication refill  Patient was aware and understood   

## 2017-02-27 NOTE — Telephone Encounter (Signed)
Pt. Called requesting a refill on on ibuprofen (ADVIL,MOTRIN) 800 MG tablet Pt. States she is in pain. Please advice?

## 2017-02-27 NOTE — Telephone Encounter (Signed)
Pt. Called requesting a refill on on ibuprofen (ADVIL,MOTRIN) 800 MG tablet Pt. States she is in pain. Please f/u

## 2017-02-27 NOTE — Telephone Encounter (Signed)
CMA call regarding medication refill   Patient verify DOB  Patient was aware and understood  

## 2017-03-24 ENCOUNTER — Telehealth: Payer: Self-pay | Admitting: Family Medicine

## 2017-03-24 NOTE — Telephone Encounter (Signed)
Pt called stating she is having a lot of pain and has continued with bleeding and would like to know what she needs to do. Pt states ibuprofen (ADVIL,MOTRIN) 800 MG tablet did not help any. Please f/up

## 2017-03-24 NOTE — Telephone Encounter (Signed)
CMA call regarding advice  Patient was aware and understood  

## 2017-03-24 NOTE — Telephone Encounter (Signed)
Pt called stating she is having a lot of pain and has continued with bleeding and would like to know what she needs to do. Pt states ibuprofen (ADVIL,MOTRIN) 800 MG tablet did not help any. Please f/up °

## 2017-03-24 NOTE — Telephone Encounter (Signed)
She was last in seen in office 08/29/2016. Was referred to gynecology at last office visit. Most recent history of follow up with gynecology. Recommend she follow up with her gynecologist office first. She can schedule follow up appointment if our clinic if needed.

## 2017-04-15 ENCOUNTER — Other Ambulatory Visit: Payer: Self-pay | Admitting: Internal Medicine

## 2017-04-15 ENCOUNTER — Other Ambulatory Visit: Payer: Self-pay | Admitting: Family Medicine

## 2017-04-15 DIAGNOSIS — I10 Essential (primary) hypertension: Secondary | ICD-10-CM

## 2017-07-04 ENCOUNTER — Encounter: Payer: Self-pay | Admitting: *Deleted

## 2017-07-04 ENCOUNTER — Other Ambulatory Visit: Payer: Self-pay

## 2017-07-04 DIAGNOSIS — Z791 Long term (current) use of non-steroidal anti-inflammatories (NSAID): Secondary | ICD-10-CM | POA: Insufficient documentation

## 2017-07-04 DIAGNOSIS — Z79899 Other long term (current) drug therapy: Secondary | ICD-10-CM | POA: Insufficient documentation

## 2017-07-04 DIAGNOSIS — Y999 Unspecified external cause status: Secondary | ICD-10-CM | POA: Diagnosis not present

## 2017-07-04 DIAGNOSIS — S0993XA Unspecified injury of face, initial encounter: Secondary | ICD-10-CM | POA: Diagnosis present

## 2017-07-04 DIAGNOSIS — Y939 Activity, unspecified: Secondary | ICD-10-CM | POA: Insufficient documentation

## 2017-07-04 DIAGNOSIS — Z87891 Personal history of nicotine dependence: Secondary | ICD-10-CM | POA: Insufficient documentation

## 2017-07-04 DIAGNOSIS — Y92410 Unspecified street and highway as the place of occurrence of the external cause: Secondary | ICD-10-CM | POA: Insufficient documentation

## 2017-07-04 DIAGNOSIS — S0083XA Contusion of other part of head, initial encounter: Secondary | ICD-10-CM | POA: Diagnosis not present

## 2017-07-04 DIAGNOSIS — I10 Essential (primary) hypertension: Secondary | ICD-10-CM | POA: Insufficient documentation
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
## 2017-07-05 ENCOUNTER — Emergency Department
Admission: EM | Admit: 2017-07-05 | Discharge: 2017-07-05 | Disposition: A | Discharge status: Home / Self Care | Payer: Medicaid Other | Attending: Emergency Medicine | Admitting: Emergency Medicine

## 2017-07-05 ENCOUNTER — Emergency Department: Payer: Medicaid Other

## 2017-07-05 DIAGNOSIS — S0083XA Contusion of other part of head, initial encounter: Secondary | ICD-10-CM

## 2017-07-05 MED ORDER — OXYCODONE-ACETAMINOPHEN 5-325 MG PO TABS
1.0000 | ORAL_TABLET | Freq: Once | ORAL | Status: AC
  Administered 2017-07-05: 1 via ORAL
  Filled 2017-07-05: qty 1
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
## 2017-07-05 NOTE — ED Notes (Signed)
Family at bedside. 
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
## 2017-07-05 NOTE — ED Notes (Signed)
Pt went with daughter to CT
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
## 2018-01-09 ENCOUNTER — Emergency Department
Admission: EM | Admit: 2018-01-09 | Discharge: 2018-01-09 | Disposition: A | Discharge status: Home / Self Care | Payer: Medicaid Other | Attending: Emergency Medicine | Admitting: Emergency Medicine

## 2018-01-09 DIAGNOSIS — I10 Essential (primary) hypertension: Secondary | ICD-10-CM | POA: Insufficient documentation

## 2018-01-09 DIAGNOSIS — Z87891 Personal history of nicotine dependence: Secondary | ICD-10-CM | POA: Diagnosis not present

## 2018-01-09 DIAGNOSIS — Z79899 Other long term (current) drug therapy: Secondary | ICD-10-CM | POA: Diagnosis not present

## 2018-01-09 DIAGNOSIS — K0889 Other specified disorders of teeth and supporting structures: Secondary | ICD-10-CM | POA: Diagnosis present

## 2018-01-09 MED ORDER — PENICILLIN V POTASSIUM 250 MG PO TABS
500.0000 mg | ORAL_TABLET | Freq: Once | ORAL | Status: AC
Start: 2018-01-09 — End: 2018-01-09
  Administered 2018-01-09: 500 mg via ORAL
  Filled 2018-01-09: qty 2

## 2018-01-09 MED ORDER — PENICILLIN V POTASSIUM 500 MG PO TABS: 500.0000 mg | ORAL_TABLET | Freq: Four times a day (QID) | ORAL | 0 refills | Status: DC

## 2018-01-09 MED ORDER — IBUPROFEN 600 MG PO TABS
600.0000 mg | ORAL_TABLET | Freq: Once | ORAL | Status: AC
  Administered 2018-01-09: 600 mg via ORAL
  Filled 2018-01-09: qty 1

## 2018-01-09 MED ORDER — HYDROCODONE-ACETAMINOPHEN 5-325 MG PO TABS: 1.0000 | ORAL_TABLET | Freq: Four times a day (QID) | ORAL | 0 refills | Status: DC | PRN

## 2018-01-09 MED ORDER — OXYCODONE-ACETAMINOPHEN 5-325 MG PO TABS
2.0000 | ORAL_TABLET | Freq: Once | ORAL | Status: AC
  Administered 2018-01-09: 2 via ORAL
  Filled 2018-01-09: qty 2

## 2018-01-09 NOTE — ED Triage Notes (Signed)
Patient c/o left dental pain.  

## 2018-01-09 NOTE — Discharge Instructions (Signed)
As we discussed, we do not see an obvious sign of infection or dental fractures at this time, but the symptoms you are describing suggest either dental infection or pain from impacted wisdom teeth.  Unfortunate this is not something we can fix in the emergency department.  We started on penicillin which should help but we strongly encourage you to follow-up at the next available opportunity with your dentist or another dentist that we will be able to see you sooner than next month.  We included a dental resource guide which may provide you with some additional options.  Please use over-the-counter ibuprofen according to label instructions and we recommend that you take ibuprofen 600 mg 3 times daily with meals to help with the pain and inflammation.

## 2018-01-09 NOTE — ED Provider Notes (Signed)
[facility] Emergency Department Provider Note  ____________________________________________   First MD Initiated Contact with Patient 01/09/18 [phone]     (approximate)  I have reviewed the triage vital signs and the nursing notes.   HISTORY  Chief Complaint Dental Pain    HPI Cindy Wang is a 37 y.o. female with no contributory past medical history presents for evaluation of gradually worsening dental pain on the left side of her mouth that started about 24 hours ago.  She woke up with pain and some subjective swelling in the very back of her mouth on the left side mostly on the upper part but also somewhat in the lower.  She reports that she has had her lower wisdom teeth removed in the past but not the upper.  She feels some swelling and pain when she bites down as well as sensitivity to heat and cold.  She has no difficulty speaking or swallowing and does not have a sore throat.  She can open her mouth without difficulty although it does reproduce the pain.  She reports that it is severe and she has not been able to sleep.  Of note she reports that she called her dentist today for an appointment but they told her they would not be able to see her for a month and that if she had an acute issue she should go to the emergency department.   Past Medical History:  Diagnosis Date  . Ectopic pregnancy   . Hypertension     Patient Active Problem List   Diagnosis Date Noted  . Hypertension 08/29/2016    Past Surgical History:  Procedure Laterality Date  . TUBAL LIGATION Bilateral     Prior to Admission medications   Medication Sig Start Date End Date Taking? Authorizing Provider  NIFEdipine (PROCARDIA-XL/ADALAT-CC/NIFEDICAL-XL) 30 MG 24 hr tablet Take 30 mg by mouth daily.   Yes [provider]  acetaminophen (TYLENOL) 500 MG tablet Take 1,000 mg by mouth every 6 (six) hours as needed for mild pain or headache.    [provider]    carvedilol (COREG) 6.25 MG tablet Take 1 tablet (6.25 mg total) by mouth 2 (two) times daily with a meal. Patient not taking: Reported on 10/22/2016 03/26/16 08/29/16  Brock Bad, MD  carvedilol (COREG) 6.25 MG tablet TAKE ONE TABLET BY MOUTH TWICE DAILY WITH MEALS 12/13/16   Hairston, Leonia Reeves R, FNP  hydrochlorothiazide (MICROZIDE) 12.5 MG capsule TAKE ONE CAPSULE BY MOUTH ONCE DAILY 12/13/16   Lizbeth Bark, FNP  HYDROcodone-acetaminophen (NORCO/VICODIN) 5-325 MG tablet Take 1-2 tablets by mouth every 6 (six) hours as needed for moderate pain. 01/09/18   Loleta Rose, MD  ibuprofen (ADVIL,MOTRIN) 800 MG tablet Take 1 tablet (800 mg total) by mouth 3 (three) times daily. 02/27/17   Quentin Angst, MD  Multiple Vitamins-Calcium (ONE-A-DAY WOMENS PO) Take 1 tablet by mouth daily.    [provider]  penicillin v potassium (VEETID) 500 MG tablet Take 1 tablet (500 mg total) by mouth 4 (four) times daily. 01/09/18   Loleta Rose, MD    Allergies Patient has no known allergies.  No family history on file.  Social History Social History   Tobacco Use  . Smoking status: Former Smoker    Types: Cigarettes  . Smokeless tobacco: Never Used  Substance Use Topics  . Alcohol use: No    Alcohol/week: 0.0 oz  . Drug use: No    Review of Systems Constitutional: No  fever/chills Eyes: No visual changes. ENT: Left-sided dental pain in the back of her mouth primarily upper, some lower, as described above Cardiovascular: Denies chest pain. Respiratory: Denies shortness of breath. Integumentary: Negative for rash. Neurological: Negative for headaches, focal weakness or numbness.   ____________________________________________   PHYSICAL EXAM:  VITAL SIGNS: ED Triage Vitals  Enc Vitals Group     BP 01/09/18 0035 (!) 155/104     Pulse Rate 01/09/18 0035 84     Resp 01/09/18 0035 18     Temp 01/09/18 0035 98.7 F (37.1 C)     Temp Source 01/09/18 0035 Oral     SpO2  01/09/18 0035 96 %     Weight 01/09/18 0034 79.4 kg (175 lb)     Height --      Head Circumference --      Peak Flow --      Pain Score 01/09/18 0034 10     Pain Loc --      Pain Edu? --      Excl. in GC? --     Constitutional: Alert and oriented.  Generally well-appearing but does appear uncomfortable Eyes: Conjunctivae are normal.  Head: Atraumatic. Nose: No congestion/rhinnorhea. Mouth/Throat: Mucous membranes are moist.  No obvious dental injury.  She is very tender to palpation along the buccal side of her gums and teeth in the back primarily on the upper but she is a little bit tender on the lower teeth as well.  There is no obvious sign of abscess or infection.  No evidence of Ludwig's angina or peritonsillar abscess.  Posterior oropharynx is normal in appearance. Neck: No stridor.  No meningeal signs.   Cardiovascular: Normal rate, regular rhythm.  Respiratory: Normal respiratory effort.  No retractions.  Neurologic:  Normal speech and language. No gross focal neurologic deficits are appreciated.  Skin:  Skin is warm, dry and intact. No rash noted. Psychiatric: Mood and affect are normal. Speech and behavior are normal.  ____________________________________________   LABS (all labs ordered are listed, but only abnormal results are displayed)  Labs Reviewed - No data to display ____________________________________________  EKG  None - EKG not ordered by ED physician ____________________________________________  RADIOLOGY   ED MD interpretation: No indication for imaging  Official radiology report(s): No results found.  ____________________________________________   PROCEDURES  Critical Care performed: No   Procedure(s) performed:   Procedures   ____________________________________________   INITIAL IMPRESSION / ASSESSMENT AND PLAN / ED COURSE  As part of my medical decision making, I reviewed the following data within the electronic MEDICAL RECORD NUMBER  Nursing notes reviewed and incorporated and [place] Controlled Substance Database    The patient may have a mild dental infection or may be having pain from impacted wisdom tooth.  Regardless I explained that she needs to follow-up with her dentist or another dentist that is willing to see her before another month passes.  To try to help I have started her on penicillin and encouraged over-the-counter NSAIDs and Tylenol.  She has no hits in the [place] controlled substance database and I will prescribe a few Norco to help her until she can follow-up with a dental provider.  There is no evidence of an emergent infection or medical condition at this time.  She understands and agrees with the plan.  I gave my usual and customary return precautions.      ____________________________________________  FINAL CLINICAL IMPRESSION(S) / ED DIAGNOSES  Final diagnoses:  Pain, dental  MEDICATIONS GIVEN DURING THIS VISIT:  Medications  penicillin v potassium (VEETID) tablet 500 mg (has no administration in time range)  oxyCODONE-acetaminophen (PERCOCET/ROXICET) 5-325 MG per tablet 2 tablet (has no administration in time range)  ibuprofen (ADVIL,MOTRIN) tablet 600 mg (has no administration in time range)     ED Discharge Orders        Ordered    penicillin v potassium (VEETID) 500 MG tablet  4 times daily     01/09/18 0340    HYDROcodone-acetaminophen (NORCO/VICODIN) 5-325 MG tablet  Every 6 hours PRN     01/09/18 0340       Note:  This document was prepared using Dragon voice recognition software and may include unintentional dictation errors.    Loleta Rose, MD 01/09/18 [phone]

## 2018-01-09 NOTE — ED Triage Notes (Signed)
Patient took 1000 mg tylenol at 2300

## 2018-10-03 IMAGING — CT CT CERVICAL SPINE W/O CM
3 of 10 series · 8 of 33 positions shown, 9 images · non-contrast
Comparison: None.

CLINICAL DATA: Tripped over dog, struck face on stairs. LEFT facial
pain, unable to open mouth.

EXAM:
CT HEAD WITHOUT CONTRAST
CT MAXILLOFACIAL WITHOUT CONTRAST
CT CERVICAL SPINE WITHOUT CONTRAST
TECHNIQUE: Multidetector CT imaging of the head, cervical spine, and
maxillofacial structures were performed using the standard protocol
without intravenous contrast. Multiplanar CT image reconstructions
of the cervical spine and maxillofacial structures were also
generated.

[Series 6: head without cor · coronal · non-contrast · 0.29mm/px · 1 of 67 slices shown]
[im 34/67  bone]
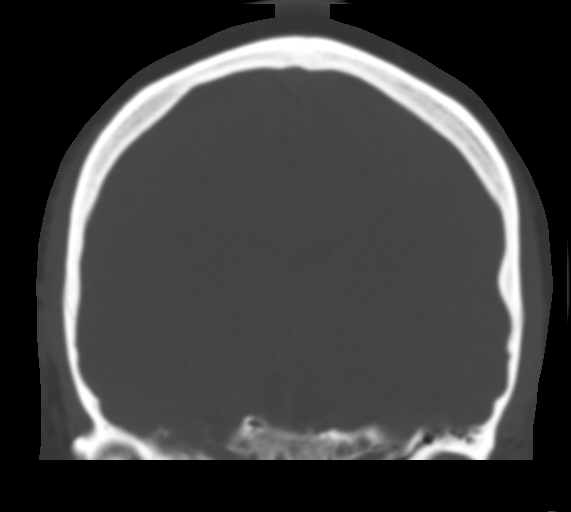

[Series 13: facialbone 2.0 sag st · sagittal · 0.30mm/px · 5 of 81 slices shown]
[im 12/81  bone]
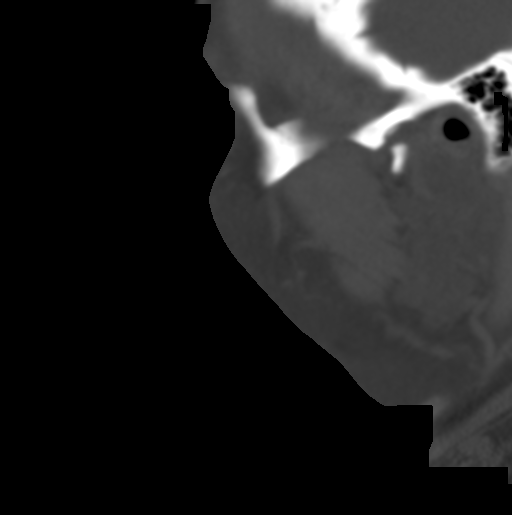
[im 23/81  bone]
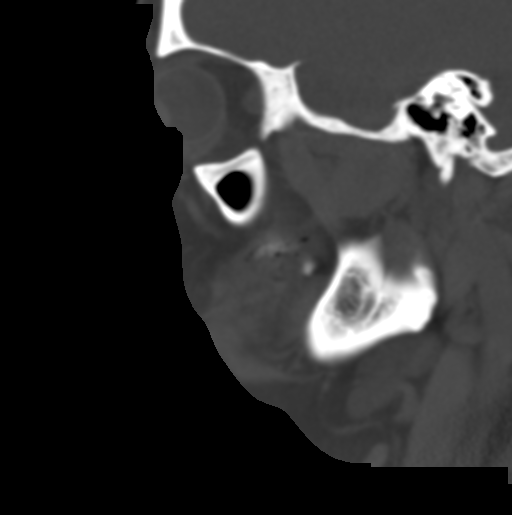
[im 35/81  bone]
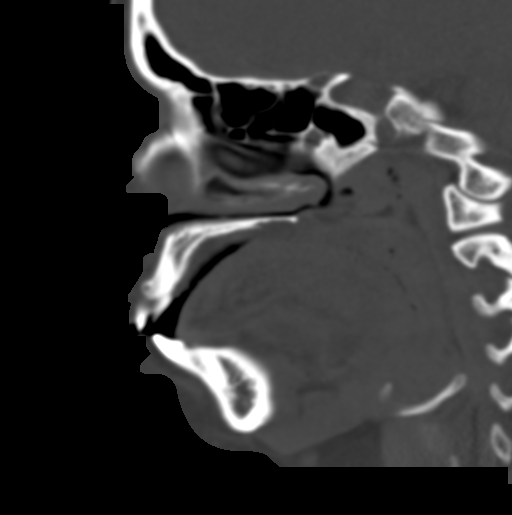
[im 46/81  bone]
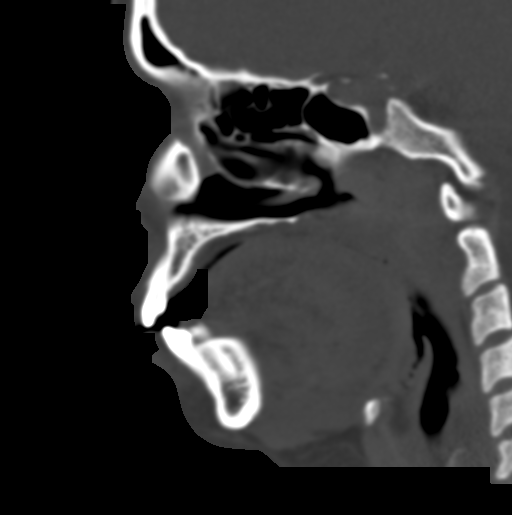
[im 58/81  bone]
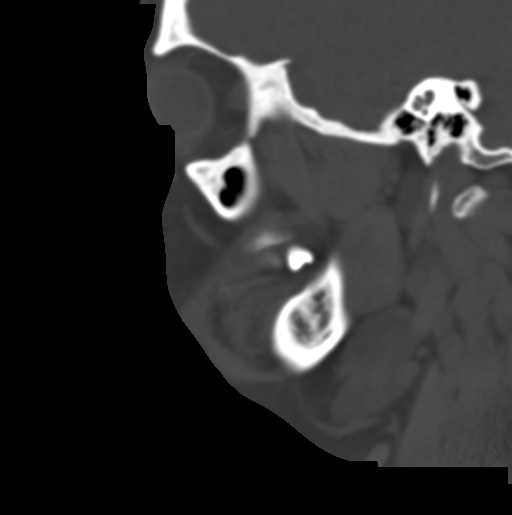

[Series 14: c_spine 2.0 st · axial · 0.30mm/px · z∈[-188,-130]mm · 2 of 89 slices shown, 3 images]
[im 30/89  soft-tissue]
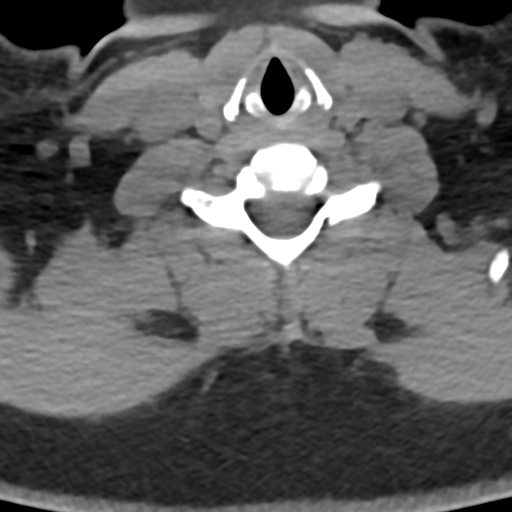
[im 30/89  bone]
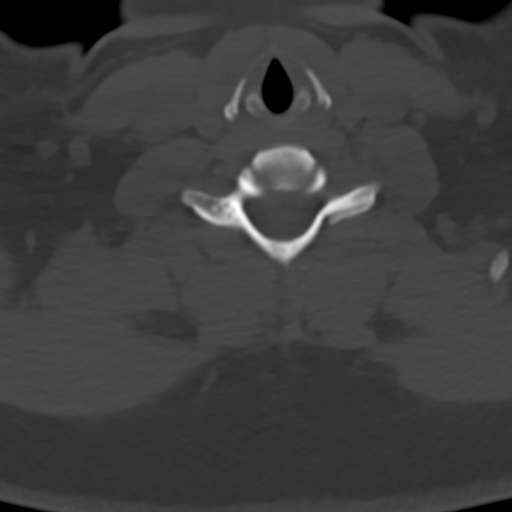
[im 59/89  bone]
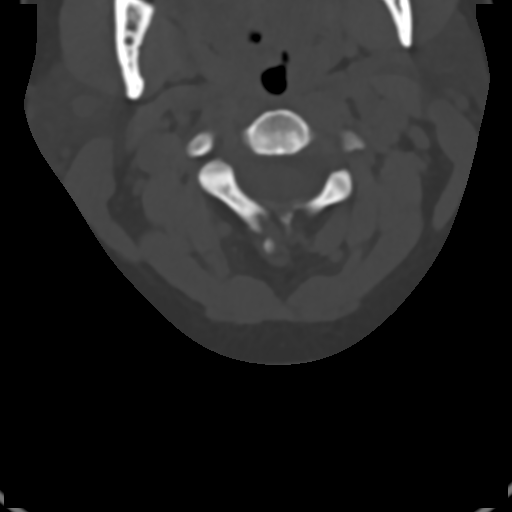

[8 of 33 positions shown; findings below may reference images not displayed]

FINDINGS: CT HEAD FINDINGS

BRAIN: The ventricles and sulci are normal. No intraparenchymal
hemorrhage, mass effect nor midline shift. No acute large vascular
territory infarcts. No abnormal extra-axial fluid collections. Basal
cisterns are patent.

VASCULAR: Unremarkable.

SKULL/SOFT TISSUES: No skull fracture. No significant soft tissue
swelling.

OTHER: None.

CT MAXILLOFACIAL FINDINGS

OSSEOUS: The mandible is intact, the condyles are located. Severe
RIGHT, mild LEFT temporomandibular osteoarthrosis. No acute facial
fracture. No destructive bony lesions.

ORBITS: Ocular globes and orbital contents are normal.

SINUSES: Trace maxillary sinus mucosal thickening without air-fluid
levels. Nasal septum is midline. Included mastoid air cells are well
aerated.

SOFT TISSUES: No significant soft tissue swelling. No subcutaneous
gas or radiopaque foreign bodies.

CT CERVICAL SPINE FINDINGS

ALIGNMENT: Cervical vertebral bodies in alignment. Straightened
cervical lordosis.

SKULL BASE AND VERTEBRAE: Cervical vertebral bodies and posterior
elements are intact. Developmentally nonunited posterior C1 arch.
Intervertebral disc heights preserved. No destructive bony lesions.
C1-2 articulation maintained.

SOFT TISSUES AND SPINAL CANAL: Included prevertebral and paraspinal
soft tissues are normal.

DISC LEVELS: No significant osseous canal stenosis or neural
foraminal narrowing.

UPPER CHEST: Lung apices are clear.

OTHER: None.
IMPRESSION: CT HEAD: Negative.

CT MAXILLOFACIAL: No acute facial fracture. Severe RIGHT and mild
LEFT temporomandibular osteoarthrosis.

CT CERVICAL SPINE: Negative.

## 2020-01-19 ENCOUNTER — Telehealth: Payer: Self-pay | Admitting: Obstetrics & Gynecology

## 2020-01-19 NOTE — Telephone Encounter (Signed)
[facility] referring for dysmenorrhea. Called and left voicemail for patient to call back to be scheduled.

## 2020-02-04 ENCOUNTER — Encounter: Payer: Commercial Managed Care - PPO | Admitting: Obstetrics and Gynecology

## 2020-03-07 ENCOUNTER — Ambulatory Visit (INDEPENDENT_AMBULATORY_CARE_PROVIDER_SITE_OTHER): Payer: Commercial Managed Care - PPO | Admitting: Obstetrics and Gynecology

## 2020-03-07 ENCOUNTER — Other Ambulatory Visit: Payer: Self-pay

## 2020-03-07 ENCOUNTER — Encounter: Payer: Self-pay | Admitting: Obstetrics and Gynecology

## 2020-03-07 ENCOUNTER — Other Ambulatory Visit (HOSPITAL_COMMUNITY)
Admission: RE | Admit: 2020-03-07 | Discharge: 2020-03-07 | Disposition: A | Discharge status: Home / Self Care | Payer: Commercial Managed Care - PPO | Source: Ambulatory Visit | Attending: Obstetrics and Gynecology | Admitting: Obstetrics and Gynecology

## 2020-03-07 ENCOUNTER — Telehealth: Payer: Self-pay

## 2020-03-07 VITALS — BP 130/70 | HR 78 | Resp 18 | Ht 61.0 in | Wt 183.2 lb

## 2020-03-07 DIAGNOSIS — D500 Iron deficiency anemia secondary to blood loss (chronic): Secondary | ICD-10-CM | POA: Diagnosis not present

## 2020-03-07 DIAGNOSIS — Z124 Encounter for screening for malignant neoplasm of cervix: Secondary | ICD-10-CM

## 2020-03-07 DIAGNOSIS — Z113 Encounter for screening for infections with a predominantly sexual mode of transmission: Secondary | ICD-10-CM | POA: Insufficient documentation

## 2020-03-07 DIAGNOSIS — N920 Excessive and frequent menstruation with regular cycle: Secondary | ICD-10-CM | POA: Diagnosis not present

## 2020-03-07 DIAGNOSIS — N946 Dysmenorrhea, unspecified: Secondary | ICD-10-CM

## 2020-03-07 MED ORDER — TRAMADOL HCL 50 MG PO TABS: 50.0000 mg | ORAL_TABLET | Freq: Four times a day (QID) | ORAL | 0 refills | Status: DC | PRN

## 2020-03-07 NOTE — Patient Instructions (Signed)
Dysmenorrhea  Dysmenorrhea refers to cramps caused by the muscles of the uterus tightening (contracting) during a menstrual period. Dysmenorrhea may be mild, or it may be severe enough to interfere with everyday activities for a few days each month. Primary dysmenorrhea is menstrual cramps that last a couple of days when you start having menstrual periods or soon after. This often begins after a teenager starts having her period. As a woman gets older or has a baby, the cramps will usually lessen or disappear. Secondary dysmenorrhea begins later in life and is caused by a disorder in the reproductive system. It lasts longer, and it may cause more pain than primary dysmenorrhea. The pain may start before the period and last a few days after the period. What are the causes? Dysmenorrhea is usually caused by an underlying problem, such as:  The tissue that lines the uterus (endometrium) growing outside of the uterus in other areas of the body (endometriosis).  Endometrial tissue growing into the muscular walls of the uterus (adenomyosis).  Blood vessels in the pelvis becoming filled with blood just before the menstrual period (pelvic congestive syndrome).  Overgrowth of cells (polyps) in the endometrium or the lower part of the uterus (cervix).  The uterus dropping down into the vagina (prolapse) due to stretched or weak muscles.  Bladder problems, such as infection or inflammation.  Intestinal problems, such as a tumor or irritable bowel syndrome.  Cancer of the reproductive organs or bladder.  A severely tipped uterus.  A cervix that is closed or has a very small opening.  Noncancerous (benign) tumors of the uterus (fibroids).  Pelvic inflammatory disease (PID).  Pelvic scarring (adhesions) from a previous surgery.  An ovarian cyst.  An IUD (intrauterine device). What increases the risk? You are more likely to develop this condition if:  You are younger than age 67.  You  started puberty early.  You have irregular or heavy bleeding.  You have never given birth.  You have a family history of dysmenorrhea.  You smoke. What are the signs or symptoms? Symptoms of this condition include:  Cramping, throbbing pain, or a feeling of fullness in the lower abdomen.  Lower back pain.  Periods lasting for longer than 7 days.  Headaches.  Bloating.  Fatigue.  Nausea or vomiting.  Diarrhea.  Sweating or dizziness.  Loose stools. How is this diagnosed? This condition may be diagnosed based on:  Your symptoms.  Your medical history.  A physical exam.  Blood tests.  A Pap test. This is a test in which cells from the cervix are tested for signs of cancer or infection.  A pregnancy test.  Imaging tests, such as: ? Ultrasound. ? A procedure to remove and examine a sample of endometrial tissue (dilation and curettage, D&C). ? A procedure to visually examine the inside of:  The uterus (hysteroscopy).  The abdomen or pelvis (laparoscopy).  The bladder (cystoscopy).  The intestine (colonoscopy).  The stomach (gastroscopy). ? X-rays. ? CT scan. ? MRI. How is this treated? Treatment depends on the cause of the dysmenorrhea. Treatment may include:  Pain medicine prescribed by your health care provider.  Birth control pills that contain the hormone progesterone.  An IUD that contains the hormone progesterone.  Medicines to control bleeding.  Hormone replacement therapy.  NSAIDs. These may help to stop the production of hormones that cause cramps.  Antidepressant medicines.  Surgery to remove adhesions, endometriosis, ovarian cysts, fibroids, or the entire uterus (hysterectomy).  Injections of progesterone  to stop the menstrual period.  A procedure to destroy the endometrium (endometrial ablation).  A procedure to cut the nerves in the bottom of the spine (sacrum) that go to the reproductive organs (presacral neurectomy).  A  procedure to apply an electric current to nerves in the sacrum (sacral nerve stimulation).  Exercise and physical therapy.  Meditation and yoga therapy.  Acupuncture. Work with your health care provider to determine what treatment or combination of treatments is best for you. Follow these instructions at home: Relieving pain and cramping  Apply heat to your lower back or abdomen when you experience pain or cramps. Use the heat source that your health care provider recommends, such as a moist heat pack or a heating pad. ? Place a towel between your skin and the heat source. ? Leave the heat on for 20-30 minutes. ? Remove the heat if your skin turns bright red. This is especially important if you are unable to feel pain, heat, or cold. You may have a greater risk of getting burned. ? Do not sleep with a heating pad on.  Do aerobic exercises, such as walking, swimming, or biking. This can help to relieve cramps.  Massage your lower back or abdomen to help relieve pain. General instructions  Take over-the-counter and prescription medicines only as told by your health care provider.  Do not drive or use heavy machinery while taking prescription pain medicine.  Avoid alcohol and caffeine during and right before your menstrual period. These can make cramps worse.  Do not use any products that contain nicotine or tobacco, such as cigarettes and e-cigarettes. If you need help quitting, ask your health care provider.  Keep all follow-up visits as told by your health care provider. This is important. Contact a health care provider if:  You have pain that gets worse or does not get better with medicine.  You have pain with sex.  You develop nausea or vomiting with your period that is not controlled with medicine. Get help right away if:  You faint. Summary  Dysmenorrhea refers to cramps caused by the muscles of the uterus tightening (contracting) during a menstrual  period.  Dysmenorrhea may be mild, or it may be severe enough to interfere with everyday activities for a few days each month.  Treatment depends on the cause of the dysmenorrhea.  Work with your health care provider to determine what treatment or combination of treatments is best for you. This information is not intended to replace advice given to you by your health care provider. Make sure you discuss any questions you have with your health care provider. Document Revised: 07/25/2017 Document Reviewed: 09/14/2016 Elsevier Patient Education  2020 [employer]  

## 2020-03-07 NOTE — Telephone Encounter (Signed)
Patient seen in the office this morning. Rx was sent to [facility]. She no longer lives in [place]. She uses [facility]. She did speak with the pharmacy and this type of rx is unable to be transferred. She is requesting it to be resent to [facility]. [phone]

## 2020-03-07 NOTE — Progress Notes (Signed)
Patient ID: [name], female   DOB: [birth year], 39 y.o.   MRN: [id number]  Reason for Consult: Gynecologic Exam   Referred by Preston Fleeting*  Subjective:     HPI:  Cindy Wang is a 39 y.o. female. She reports that she has been having severe pain also.  And heavy menstrual bleeding.  She reports that these severe pains have been present since she had an ectopic pregnancy in October 2017.  She reports labor-like pains with her menstrual cycle.  She reports that her whole body hurts from her shoulders to her at the time of her thighs.  She reports that her vagina hurts.  She reports that she does not want to know why her bottom and feels suprapubic pressure in her pelvis without the need to urinate or have a bowel movement.  She has breast tenderness and back pain during her menstrual cycle.  She has been unable to achieve relief with taking ibuprofen.  She is tired of hurting every month.  She states that the pain starts a couple days ahead of her menstrual cycle.  She reports that over the last 4 years she has tried Tylenol and ibuprofen without without relief.  She is also taking oral contraceptive pill for the last 3 months which has not improved her symptoms.  She has tried other homeopathic options such as exercise and vitamin C and heating pads. she recently had a CT scan which did not show any significant findings.  She reports that she also always feels cold.   She reports that her menstrual cycle generally last for 5 days and she will have moderate to heavy bleeding.  She feels gushing and flooding sensations especially when standing after laying down or sitting for longer periods of time.  She passes golf ball size clots.  She notes nausea vomiting diarrhea and constipation during her menstrual cycle.  She denies any history of fibroids polyps or ovarian cyst in the past.   She is sexually active and notes some pain with intercourse after orgasm.  She has a history of a  tubal ligation although the ectopic pregnancy occurred on the right after her tubal ligation.     Past Medical History:  Diagnosis Date  . Ectopic pregnancy   . Hypertension    History reviewed. No pertinent family history. Past Surgical History:  Procedure Laterality Date  . TUBAL LIGATION Bilateral     Short Social History:  Social History   Tobacco Use  . Smoking status: Former Smoker    Types: Cigarettes  . Smokeless tobacco: Never Used  Substance Use Topics  . Alcohol use: No    Alcohol/week: 0.0 standard drinks    No Known Allergies  Current Outpatient Medications  Medication Sig Dispense Refill  . ibuprofen (ADVIL) 600 MG tablet Take by mouth.    Marland Kitchen acetaminophen (TYLENOL) 500 MG tablet Take 1,000 mg by mouth every 6 (six) hours as needed for mild pain or headache.    . ibuprofen (ADVIL,MOTRIN) 800 MG tablet Take 1 tablet (800 mg total) by mouth 3 (three) times daily. 30 tablet 0  . Multiple Vitamins-Calcium (ONE-A-DAY WOMENS PO) Take 1 tablet by mouth daily.    Marland Kitchen NIFEdipine (ADALAT CC) 30 MG 24 hr tablet Take by mouth.    Marland Kitchen NIFEdipine (PROCARDIA-XL/ADALAT-CC/NIFEDICAL-XL) 30 MG 24 hr tablet Take 30 mg by mouth daily.     No current facility-administered medications for this visit.    Review of Systems  Constitutional:  Negative for chills, fatigue, fever and unexpected weight change.  HENT: Negative for trouble swallowing.  Eyes: Negative for loss of vision.  Respiratory: Negative for cough, shortness of breath and wheezing.  Cardiovascular: Negative for chest pain, leg swelling, palpitations and syncope.  GI: Negative for abdominal pain, blood in stool, diarrhea, nausea and vomiting.  GU: Negative for difficulty urinating, dysuria, frequency and hematuria.  Musculoskeletal: Negative for back pain, leg pain and joint pain.  Skin: Negative for rash.  Neurological: Negative for dizziness, headaches, light-headedness, numbness and seizures.  Psychiatric:  Negative for behavioral problem, confusion, depressed mood and sleep disturbance.        Objective:  Objective   Vitals:   03/07/20 0940  BP: 130/70  Pulse: 78  Resp: 18  SpO2: 98%  Weight: 183 lb 3.2 oz (83.1 kg)  Height: 5\' 1"  (1.549 m)   Body mass index is 34.62 kg/m.  Physical Exam Vitals and nursing note reviewed. Exam conducted with a chaperone present.  Constitutional:      Appearance: She is well-developed.  HENT:     Head: Normocephalic and atraumatic.  Eyes:     Pupils: Pupils are equal, round, and reactive to light.  Cardiovascular:     Rate and Rhythm: Normal rate and regular rhythm.  Pulmonary:     Effort: Pulmonary effort is normal. No respiratory distress.  Genitourinary:    Comments: External: Normal appearing vulva. No lesions noted.  Speculum examination: Normal appearing cervix. 10-15 cc of dark red blood in the vaginal vault. Bimanual examination: Uterus midline, non-tender, normal in size, shape and contour.  No CMT. No adnexal masses. No adnexal tenderness. Pelvis not fixed.    Skin:    General: Skin is warm and dry.  Neurological:     Mental Status: She is alert and oriented to person, place, and time.  Psychiatric:        Behavior: Behavior normal.        Thought Content: Thought content normal.        Judgment: Judgment normal.     Assessment/Plan:     39 yo with Severe dysmenorrhea and menorrhagia- no improvement after 3 months of OCP usage.  Will follow up for pelvic 41. Patient will consider medical vs surgical management. Patient at this time is hesitant to be on any hormone based therapies such as Depo Provera or  IUD.  Discussed taking tylenol 1000 mg and motrin 600 mg every 6 hours during her menstrual cycle for pain.  Rx for Tramadol sent to pharmacy. 10 pills only. Advised not to drive when using this medication.  Will check TSH and CBC/Ferritin for hx of menorrhagia and possible chronic blood loss anemia.    More than 45  minutes were spent face to face with the patient in the room, reviewing the medical record, labs and images, and coordinating care for the patient. The plan of management was discussed in detail and counseling was provided.    [place] MD [facility], [facility] 03/07/2020 10:56 AM

## 2020-03-08 ENCOUNTER — Encounter: Payer: Self-pay | Admitting: Obstetrics and Gynecology

## 2020-03-08 LAB — CBC WITH DIFFERENTIAL
Basophils Absolute: 0.1 10*3/uL (ref 0.0–0.2)
Basos: 1 %
EOS (ABSOLUTE): 0.2 10*3/uL (ref 0.0–0.4)
Eos: 2 %
Hematocrit: 42 % (ref 34.0–46.6)
Hemoglobin: 13.6 g/dL (ref 11.1–15.9)
Immature Grans (Abs): 0.1 10*3/uL (ref 0.0–0.1)
Immature Granulocytes: 1 %
Lymphocytes Absolute: 3 10*3/uL (ref 0.7–3.1)
Lymphs: 34 %
MCH: 27.5 pg (ref 26.6–33.0)
MCHC: 32.4 g/dL (ref 31.5–35.7)
MCV: 85 fL (ref 79–97)
Monocytes Absolute: 0.8 10*3/uL (ref 0.1–0.9)
Monocytes: 9 %
Neutrophils Absolute: 4.8 10*3/uL (ref 1.4–7.0)
Neutrophils: 53 %
RBC: 4.95 x10E6/uL (ref 3.77–5.28)
RDW: 14.5 % (ref 11.7–15.4)
WBC: 8.9 10*3/uL (ref 3.4–10.8)

## 2020-03-08 LAB — FERRITIN: Ferritin: 40 ng/mL (ref 15–150)

## 2020-03-08 LAB — TSH+FREE T4
Free T4: 0.99 ng/dL (ref 0.82–1.77)
TSH: 1.99 u[IU]/mL (ref 0.450–4.500)

## 2020-03-08 MED ORDER — TRAMADOL HCL 50 MG PO TABS: 50.0000 mg | ORAL_TABLET | Freq: Four times a day (QID) | ORAL | 0 refills | Status: DC | PRN

## 2020-03-08 NOTE — Telephone Encounter (Signed)
I called and cancelled the prescription in [place] and sent a new one to [facility], please let patient know

## 2020-03-09 NOTE — Telephone Encounter (Signed)
Patient aware. Pharmacy notified her and she picked rx up yesterday.

## 2020-03-10 LAB — CYTOLOGY - PAP
Chlamydia: NEGATIVE
Comment: NEGATIVE
Comment: NEGATIVE
Comment: NEGATIVE
Comment: NORMAL
Diagnosis: NEGATIVE
High risk HPV: NEGATIVE
Neisseria Gonorrhea: NEGATIVE
Trichomonas: NEGATIVE

## 2020-04-10 ENCOUNTER — Ambulatory Visit: Payer: Commercial Managed Care - PPO

## 2020-04-10 ENCOUNTER — Ambulatory Visit: Payer: Commercial Managed Care - PPO | Admitting: Obstetrics and Gynecology

## 2020-04-24 ENCOUNTER — Ambulatory Visit: Payer: Commercial Managed Care - PPO

## 2020-04-24 ENCOUNTER — Ambulatory Visit: Payer: Commercial Managed Care - PPO | Admitting: Obstetrics and Gynecology

## 2020-05-09 ENCOUNTER — Ambulatory Visit: Payer: Commercial Managed Care - PPO

## 2020-05-09 ENCOUNTER — Ambulatory Visit: Payer: Commercial Managed Care - PPO | Admitting: Obstetrics and Gynecology

## 2020-05-09 ENCOUNTER — Other Ambulatory Visit: Payer: Self-pay | Admitting: Obstetrics and Gynecology

## 2020-05-09 DIAGNOSIS — N946 Dysmenorrhea, unspecified: Secondary | ICD-10-CM

## 2020-05-09 MED ORDER — TRAMADOL HCL 50 MG PO TABS: 50.0000 mg | ORAL_TABLET | Freq: Four times a day (QID) | ORAL | 0 refills | Status: DC | PRN

## 2020-05-29 ENCOUNTER — Other Ambulatory Visit (INDEPENDENT_AMBULATORY_CARE_PROVIDER_SITE_OTHER): Payer: Self-pay

## 2020-05-29 DIAGNOSIS — N946 Dysmenorrhea, unspecified: Secondary | ICD-10-CM

## 2020-08-24 ENCOUNTER — Other Ambulatory Visit: Payer: Self-pay

## 2020-08-24 ENCOUNTER — Other Ambulatory Visit (INDEPENDENT_AMBULATORY_CARE_PROVIDER_SITE_OTHER): Payer: Self-pay

## 2020-08-24 DIAGNOSIS — N946 Dysmenorrhea, unspecified: Secondary | ICD-10-CM

## 2020-10-16 ENCOUNTER — Other Ambulatory Visit: Payer: Self-pay

## 2020-10-16 DIAGNOSIS — N946 Dysmenorrhea, unspecified: Secondary | ICD-10-CM

## 2020-10-17 MED ORDER — TRAMADOL HCL 50 MG PO TABS: 50.0000 mg | ORAL_TABLET | Freq: Four times a day (QID) | ORAL | 0 refills | Status: DC | PRN

## 2020-10-24 DIAGNOSIS — Z1371 Encounter for nonprocreative screening for genetic disease carrier status: Secondary | ICD-10-CM

## 2020-10-24 DIAGNOSIS — Z8041 Family history of malignant neoplasm of ovary: Secondary | ICD-10-CM

## 2020-10-24 HISTORY — DX: Family history of malignant neoplasm of ovary: Z80.41

## 2020-10-24 HISTORY — DX: Encounter for nonprocreative screening for genetic disease carrier status: Z13.71

## 2020-10-27 ENCOUNTER — Other Ambulatory Visit: Payer: Self-pay | Admitting: Obstetrics and Gynecology

## 2020-10-27 DIAGNOSIS — N946 Dysmenorrhea, unspecified: Secondary | ICD-10-CM

## 2020-10-27 DIAGNOSIS — N921 Excessive and frequent menstruation with irregular cycle: Secondary | ICD-10-CM

## 2020-10-31 ENCOUNTER — Encounter: Payer: Self-pay | Admitting: Obstetrics and Gynecology

## 2020-10-31 ENCOUNTER — Other Ambulatory Visit: Payer: Self-pay

## 2020-10-31 ENCOUNTER — Ambulatory Visit (INDEPENDENT_AMBULATORY_CARE_PROVIDER_SITE_OTHER): Payer: Commercial Managed Care - PPO | Admitting: Obstetrics and Gynecology

## 2020-10-31 ENCOUNTER — Ambulatory Visit (INDEPENDENT_AMBULATORY_CARE_PROVIDER_SITE_OTHER): Payer: Commercial Managed Care - PPO

## 2020-10-31 VITALS — BP 130/78 | Ht 60.0 in | Wt 181.4 lb

## 2020-10-31 DIAGNOSIS — N921 Excessive and frequent menstruation with irregular cycle: Secondary | ICD-10-CM | POA: Diagnosis not present

## 2020-10-31 DIAGNOSIS — N83202 Unspecified ovarian cyst, left side: Secondary | ICD-10-CM

## 2020-10-31 DIAGNOSIS — N946 Dysmenorrhea, unspecified: Secondary | ICD-10-CM

## 2020-10-31 DIAGNOSIS — N920 Excessive and frequent menstruation with regular cycle: Secondary | ICD-10-CM

## 2020-10-31 DIAGNOSIS — Z8041 Family history of malignant neoplasm of ovary: Secondary | ICD-10-CM

## 2020-10-31 DIAGNOSIS — N840 Polyp of corpus uteri: Secondary | ICD-10-CM

## 2020-10-31 NOTE — Progress Notes (Signed)
Patient ID: Cindy Wang, female   DOB: Oct 27, 1980, 40 y.o.   MRN: [id number]  Reason for Consult: Gynecologic Exam   Referred by No ref. provider found  Subjective:     HPI:  Cindy Wang is a 40 y.o. female.  She is presenting today for follow-up regarding dysmenorrhea and menorrhagia.  She had a pelvic ultrasound which was performed today.  She reports that she has had continued pain and heavy bleeding on menstrual cycle.  She has been taking Tylenol and Aleve which helps with some her pain during her menstrual cycle.  Occasionally she uses tramadol.   Gynecological History  Patient's last menstrual period was 10/02/2020. Last Pap: Results were: 03/07/2020 NIL and HR HPV negative   Obstetrical History OB History  Gravida Para Term Preterm AB Living  4 3 3     3   SAB IAB Ectopic Multiple Live Births          3    # Outcome Date GA Lbr Len/2nd Weight Sex Delivery Anes PTL Lv  4 Term 02/25/09    M Vag-Spont EPI  LIV  3 Term 12/20/05    F Vag-Spont   LIV  2 Term 03/27/99    M Vag-Spont   LIV  1 Gravida              Past Medical History:  Diagnosis Date  . Ectopic pregnancy   . Hypertension    Family History  Problem Relation Age of Onset  . Ovarian cancer Maternal Aunt    Past Surgical History:  Procedure Laterality Date  . TUBAL LIGATION Bilateral     Short Social History:  Social History   Tobacco Use  . Smoking status: Former Smoker    Types: Cigarettes  . Smokeless tobacco: Never Used  Substance Use Topics  . Alcohol use: No    Alcohol/week: 0.0 standard drinks    No Known Allergies  Current Outpatient Medications  Medication Sig Dispense Refill  . traMADol (ULTRAM) 50 MG tablet Take 1 tablet (50 mg total) by mouth every 6 (six) hours as needed. 10 tablet 0  . acetaminophen (TYLENOL) 500 MG tablet Take 1,000 mg by mouth every 6 (six) hours as needed for mild pain or headache.    . ibuprofen (ADVIL) 600 MG tablet Take by mouth.    05/27/99  ibuprofen (ADVIL,MOTRIN) 800 MG tablet Take 1 tablet (800 mg total) by mouth 3 (three) times daily. 30 tablet 0  . Multiple Vitamins-Calcium (ONE-A-DAY WOMENS PO) Take 1 tablet by mouth daily.    Marland Kitchen NIFEdipine (ADALAT CC) 30 MG 24 hr tablet Take by mouth.    Marland Kitchen NIFEdipine (PROCARDIA-XL/ADALAT-CC/NIFEDICAL-XL) 30 MG 24 hr tablet Take 30 mg by mouth daily.     No current facility-administered medications for this visit.    Review of Systems  Constitutional: Negative for chills, fatigue, fever and unexpected weight change.  HENT: Negative for trouble swallowing.  Eyes: Negative for loss of vision.  Respiratory: Negative for cough, shortness of breath and wheezing.  Cardiovascular: Negative for chest pain, leg swelling, palpitations and syncope.  GI: Negative for abdominal pain, blood in stool, diarrhea, nausea and vomiting.  GU: Negative for difficulty urinating, dysuria, frequency and hematuria.  Musculoskeletal: Negative for back pain, leg pain and joint pain.  Skin: Negative for rash.  Neurological: Negative for dizziness, headaches, light-headedness, numbness and seizures.  Psychiatric: Negative for behavioral problem, confusion, depressed mood and sleep disturbance.  Objective:  Objective   Vitals:   10/31/20 1003  BP: 130/78  Weight: 181 lb 6.4 oz (82.3 kg)  Height: 5' (1.524 m)   Body mass index is 35.43 kg/m.  Physical Exam Vitals and nursing note reviewed. Exam conducted with a chaperone present.  Constitutional:      Appearance: Normal appearance.  HENT:     Head: Normocephalic and atraumatic.  Eyes:     Extraocular Movements: Extraocular movements intact.     Pupils: Pupils are equal, round, and reactive to light.  Cardiovascular:     Rate and Rhythm: Normal rate and regular rhythm.  Pulmonary:     Effort: Pulmonary effort is normal.     Breath sounds: Normal breath sounds.  Abdominal:     General: Abdomen is flat.     Palpations: Abdomen is soft.   Musculoskeletal:     Cervical back: Normal range of motion.  Skin:    General: Skin is warm and dry.  Neurological:     General: No focal deficit present.     Mental Status: She is alert and oriented to person, place, and time.  Psychiatric:        Behavior: Behavior normal.        Thought Content: Thought content normal.        Judgment: Judgment normal.     Assessment/Plan:     40 year old with dysmenorrhea and menorrhagia 1. Transvaginal ultrasound today showed a endometrial thickening likely secondary to endometrial polyp.  We will plan a hysteroscopy D&C with MyoSure polypectomy to remove this endometrial polyp.  This may be contributing to heavy bleeding and menorrhagia.  2. Family history of ovarian cancer - myrisk testing today  3. Complex left ovarian cyst seen.  Patient has a history of a left ectopic and it may be a hydrosalpinx that is being seen. Will follow up in 6-12 weeks to repeat pelvic US  Ultimately patient is most interested in definitive surgical therapy with hysterectomy.  We discussed that this may not be necessary if improvement is seen with removal of the endometrial polyp.  However we can consider this if her pain and heavy bleeding continues after the hysteroscopy.  More than 20 minutes were spent face to face with the patient in the room, reviewing the medical record, labs and images, and coordinating care for the patient. The plan of management was discussed in detail and counseling was provided.      Adelene Idler MD [facility], [facility] 10/31/2020 11:00 AM

## 2020-10-31 NOTE — Patient Instructions (Signed)
Hysteroscopy Hysteroscopy is a procedure used to look inside a woman's womb (uterus). This may be done for various reasons, including:  To look for tumors and other growths in the uterus.  To evaluate abnormal bleeding, fibroid tumors, polyps, scar tissue, or uterine cancer.  To determine why a woman is unable to get pregnant or has had repeated pregnancy losses.  To locate an IUD (intrauterine device).  To place a birth control device into the fallopian tubes. During this procedure, a thin, flexible tube with a small light and camera (hysteroscope) is used to examine the uterus. The camera sends images to a monitor in the room so that your health care provider can view the inside of your uterus. A hysteroscopy should be done right after a menstrual period. Tell a health care provider about:  Any allergies you have.  All medicines you are taking, including vitamins, herbs, eye drops, creams, and over-the-counter medicines.  Any problems you or family members have had with anesthetic medicines.  Any blood disorders you have.  Any surgeries you have had.  Any medical conditions you have.  Whether you are pregnant or may be pregnant.  Whether you have been diagnosed with an STI (sexually transmitted infection) or you think you have an STI. What are the risks? Generally, this is a safe procedure. However, problems may occur, including:  Excessive bleeding.  Infection.  Damage to the uterus or other structures or organs.  Allergic reaction to medicines or fluids that are used in the procedure. What happens before the procedure? Staying hydrated Follow instructions from your health care provider about hydration, which may include:  Up to 2 hours before the procedure - you may continue to drink clear liquids, such as water, clear fruit juice, black coffee, and plain tea. Eating and drinking restrictions Follow instructions from your health care provider about eating and  drinking, which may include:  8 hours before the procedure - stop eating solid foods and drink clear liquids only.  2 hours before the procedure - stop drinking clear liquids. Medicines  Ask your health care provider about: ? Changing or stopping your regular medicines. This is especially important if you are taking diabetes medicines or blood thinners. ? Taking medicines such as aspirin and ibuprofen. These medicines can thin your blood. Do not take these medicines unless your health care provider tells you to take them. ? Taking over-the-counter medicines, vitamins, herbs, and supplements.  Medicine may be placed in your cervix the day before the procedure. This medicine causes the cervix to open (dilate). The larger opening makes it easier for the hysteroscope to be inserted into the uterus during the procedure. General instructions  Ask your health care provider: ? What steps will be taken to help prevent infection. These steps may include:  Washing skin with a germ-killing soap.  Taking antibiotic medicine.  Do not use any products that contain nicotine or tobacco for at least 4 weeks before the procedure. These products include cigarettes, chewing tobacco, and vaping devices, such as e-cigarettes. If you need help quitting, ask your health care provider.  Plan to have a responsible adult take you home from the hospital or clinic.  Plan to have a responsible adult care for you for the time you are told after you leave the hospital or clinic. This is important.  Empty your bladder before the procedure begins. What happens during the procedure?  An IV will be inserted into one of your veins.  You may be given: ?   A medicine to help you relax (sedative). ? A medicine that numbs the area around the cervix (local anesthetic). ? A medicine to make you fall asleep (general anesthetic).  A hysteroscope will be inserted through your vagina and into your uterus.  Air or fluid will  be used to enlarge your uterus to allow your health care provider to see it better. The amount of fluid used will be carefully checked throughout the procedure.  In some cases, tissue may be gently scraped from inside the uterus and sent to a lab for testing (biopsy). The procedure may vary among health care providers and hospitals. What can I expect after the procedure?  Your blood pressure, heart rate, breathing rate, and blood oxygen level will be monitored until you leave the hospital or clinic.  You may have cramps. You may be given medicines for this.  You may have bleeding, which may vary from light spotting to menstrual-like bleeding. This is normal.  If you had a biopsy, it is up to you to get the results. Ask your health care provider, or the department that is doing the procedure, when your results will be ready. Follow these instructions at home: Activity  Rest as told by your health care provider.  Return to your normal activities as told by your health care provider. Ask your health care provider what activities are safe for you.  If you were given a sedative during the procedure, it can affect you for several hours. Do not drive or operate machinery until your health care provider says that it is safe. Medicines  Do not take aspirin or other NSAIDs during recovery, as told by your healthcare provider. It can increase the risk of bleeding.  Ask your health care provider if the medicine prescribed to you: ? Requires you to avoid driving or using machinery. ? Can cause constipation. You may need to take these actions to prevent or treat constipation:  Drink enough fluid to keep your urine pale yellow.  Take over-the-counter or prescription medicines.  Eat foods that are high in fiber, such as beans, whole grains, and fresh fruits and vegetables.  Limit foods that are high in fat and processed sugars, such as fried or sweet foods. General instructions  Do not douche,  use tampons, or have sex for 2 weeks after the procedure, or until your health care provider approves.  Do not take baths, swim, or use a hot tub until your health care provider approves. Take showers instead of baths for 2 weeks, or for as long as told by your health care provider.  Keep all follow-up visits. This is important. Contact a health care provider if:  You feel dizzy or lightheaded.  You feel nauseous.  You have abnormal vaginal discharge.  You have a rash.  You have pain that does not get better with medicine.  You have chills. Get help right away if:  You have bleeding that is heavier than a normal menstrual period.  You have a fever.  You have pain or cramps that get worse.  You develop new abdominal pain.  You faint.  You have pain in your shoulder.  You are short of breath. Summary  Hysteroscopy is a procedure that is used to look inside a woman's womb (uterus).  After the procedure, you may have bleeding, which varies from light spotting to menstrual-like bleeding. This is normal. You may also have cramps.  Do not douche, use tampons, or have sex for 2 weeks after   the procedure, or until your health care provider approves.  Plan to have a responsible adult take you home from the hospital or clinic. This information is not intended to replace advice given to you by your health care provider. Make sure you discuss any questions you have with your health care provider. Document Revised: 03/29/2020 Document Reviewed: 03/29/2020 Elsevier Patient Education  2021 [employer]  

## 2020-11-07 ENCOUNTER — Telehealth: Payer: Self-pay

## 2020-11-07 NOTE — Telephone Encounter (Signed)
-----   Message from Natale Milch, MD sent at 10/31/2020 11:01 AM EST ----- Surgery Booking Request Patient Full Name:  Cindy Wang  MRN: [id number]  DOB: 1980/09/07  Surgeon: Natale Milch, MD  Requested Surgery Date and Time: asap Primary Diagnosis AND Code: endometrial polyp Secondary Diagnosis and Code:  Surgical Procedure: Hysteroscopy D&C, myosure polypectomy RNFA Requested?: No L&D Notification: No Admission Status: same day surgery Length of Surgery: 50 min Special Case Needs: No H&P: No Phone Interview???:  Yes Interpreter: No Medical Clearance:  No Special Scheduling Instructions: No Any known health/anesthesia issues, diabetes, sleep apnea, latex allergy, defibrillator/pacemaker?: No Acuity: P3   (P1 highest, P2 delay may cause harm, P3 low, elective gyn, P4 lowest)

## 2020-11-07 NOTE — Telephone Encounter (Signed)
Called patient to schedule hysteroscopy D&C, Myosure polypectomy w Jerene Pitch  DOS 4/21  H&P N/A    Covid testing 4/19 @ [facility]. Advised pt to quarantine until DOS.  Pre-admit phone call appointment to be requested All appointments will be updated on pt MyChart. Explained that this appointment has a call window. Based on the time scheduled will indicate if the call will be received within a 4 hour window before 1:00 or after.  Advised that pt may also receive calls from the hospital pharmacy and pre-service center.  Confirmed pt has [employer] as Editor, commissioning. No secondary insurance.

## 2020-11-23 ENCOUNTER — Encounter: Payer: Self-pay | Admitting: Obstetrics and Gynecology

## 2020-11-29 ENCOUNTER — Ambulatory Visit (INDEPENDENT_AMBULATORY_CARE_PROVIDER_SITE_OTHER): Payer: Commercial Managed Care - PPO

## 2020-11-29 ENCOUNTER — Other Ambulatory Visit: Payer: Self-pay

## 2020-11-29 DIAGNOSIS — N83202 Unspecified ovarian cyst, left side: Secondary | ICD-10-CM | POA: Diagnosis not present

## 2020-12-06 ENCOUNTER — Encounter
Admission: RE | Admit: 2020-12-06 | Discharge: 2020-12-06 | Disposition: A | Discharge status: Home / Self Care | Payer: Commercial Managed Care - PPO | Source: Ambulatory Visit | Attending: Obstetrics and Gynecology | Admitting: Obstetrics and Gynecology

## 2020-12-06 ENCOUNTER — Other Ambulatory Visit: Payer: Self-pay

## 2020-12-06 HISTORY — DX: Headache, unspecified: R51.9

## 2020-12-06 NOTE — Patient Instructions (Signed)
Your procedure is scheduled on:12-14-20 THURSDAY Report to the Registration Desk on the 1st floor of the [facility]-Then proceed to the 2nd floor Surgery Desk in the Medical [facility] To find out your arrival time, please call [phone] between 1PM - 3PM on:12-13-20 WEDNESDAY  REMEMBER: Instructions that are not followed completely may result in serious medical risk, up to and including death; or upon the discretion of your surgeon and anesthesiologist your surgery may need to be rescheduled.  Do not eat food after midnight the night before surgery.  No gum chewing, lozengers or hard candies.  You may however, drink CLEAR liquids up to 2 hours before you are scheduled to arrive for your surgery. Do not drink anything within 2 hours of your scheduled arrival time.  Clear liquids include: - water  - apple juice without pulp - gatorade (not RED) - black coffee or tea (Do NOT add milk or creamers to the coffee or tea) Do NOT drink anything that is not on this list.   TAKE THESE MEDICATIONS THE MORNING OF SURGERY WITH A SIP OF WATER: -NIFEDIPINE (PROCARDIA)  One week prior to surgery: Stop Anti-inflammatories (NSAIDS) such as Advil, Aleve, Ibuprofen, Motrin, Naproxen, Naprosyn and Aspirin based products such as Excedrin, Goodys Powder, BC Powder-OK TO TAKE TYLENOL IF NEEDED  Stop ANY OVER THE COUNTER supplements until after surgery.-HOWEVER, YOU MAY CONTINUE YOUR ONE A DAY MULTIVITAMIN UP UNTIL THE DAY PRIOR TO SURGERY  No Alcohol for 24 hours before or after surgery.  No Smoking including e-cigarettes for 24 hours prior to surgery.  No chewable tobacco products for at least 6 hours prior to surgery.  No nicotine patches on the day of surgery.  Do not use any "recreational" drugs for at least a week prior to your surgery.  Please be advised that the combination of cocaine and anesthesia may have negative outcomes, up to and including death. If you test positive for cocaine, your  surgery will be cancelled.  On the morning of surgery brush your teeth with toothpaste and water, you may rinse your mouth with mouthwash if you wish. Do not swallow any toothpaste or mouthwash.  Do not wear jewelry, make-up, hairpins, clips or nail polish.  Do not wear lotions, powders, or perfumes.   Do not shave body from the neck down 48 hours prior to surgery just in case you cut yourself which could leave a site for infection.  Also, freshly shaved skin may become irritated if using the CHG soap.  Contact lenses, hearing aids and dentures may not be worn into surgery.  Do not bring valuables to the hospital. [facility] is not responsible for any missing/lost belongings or valuables.  Notify your doctor if there is any change in your medical condition (cold, fever, infection).  Wear comfortable clothing (specific to your surgery type) to the hospital.  Plan for stool softeners for home use; pain medications have a tendency to cause constipation. You can also help prevent constipation by eating foods high in fiber such as fruits and vegetables and drinking plenty of fluids as your diet allows.  After surgery, you can help prevent lung complications by doing breathing exercises.  Take deep breaths and cough every 1-2 hours. Your doctor may order a device called an Incentive Spirometer to help you take deep breaths. When coughing or sneezing, hold a pillow firmly against your incision with both hands. This is called "splinting." Doing this helps protect your incision. It also decreases belly discomfort.  If  you are being admitted to the hospital overnight, leave your suitcase in the car. After surgery it may be brought to your room.  If you are being discharged the day of surgery, you will not be allowed to drive home. You will need a responsible adult (18 years or older) to drive you home and stay with you that night.   If you are taking public transportation, you will need to  have a responsible adult (18 years or older) with you. Please confirm with your physician that it is acceptable to use public transportation.   Please call the Pre-admissions Testing Dept. at [phone] if you have any questions about these instructions.  Surgery Visitation Policy:  Patients undergoing a surgery or procedure may have one family member or support person with them as long as that person is not COVID-19 positive or experiencing its symptoms.  That person may remain in the waiting area during the procedure.  Inpatient Visitation:    Visiting hours are 7 a.m. to 8 p.m. Inpatients will be allowed two visitors daily. The visitors may change each day during the patient's stay. No visitors under the age of 8. Any visitor under the age of 36 must be accompanied by an adult. The visitor must pass COVID-19 screenings, use hand sanitizer when entering and exiting the patient's room and wear a mask at all times, including in the patient's room. Patients must also wear a mask when staff or their visitor are in the room. Masking is required regardless of vaccination status.

## 2020-12-06 NOTE — Telephone Encounter (Signed)
Orders placed.

## 2020-12-11 ENCOUNTER — Encounter
Admission: RE | Admit: 2020-12-11 | Discharge: 2020-12-11 | Disposition: A | Discharge status: Home / Self Care | Payer: Commercial Managed Care - PPO | Source: Ambulatory Visit | Attending: Obstetrics and Gynecology | Admitting: Obstetrics and Gynecology

## 2020-12-11 ENCOUNTER — Other Ambulatory Visit: Payer: Self-pay

## 2020-12-11 DIAGNOSIS — Z01818 Encounter for other preprocedural examination: Secondary | ICD-10-CM | POA: Insufficient documentation

## 2020-12-11 DIAGNOSIS — Z20822 Contact with and (suspected) exposure to covid-19: Secondary | ICD-10-CM | POA: Insufficient documentation

## 2020-12-11 DIAGNOSIS — I10 Essential (primary) hypertension: Secondary | ICD-10-CM | POA: Insufficient documentation

## 2020-12-11 LAB — SARS CORONAVIRUS 2 (TAT 6-24 HRS): SARS Coronavirus 2: NEGATIVE

## 2020-12-12 ENCOUNTER — Other Ambulatory Visit: Payer: Commercial Managed Care - PPO

## 2020-12-12 ENCOUNTER — Ambulatory Visit: Payer: Commercial Managed Care - PPO

## 2020-12-12 ENCOUNTER — Ambulatory Visit: Payer: Commercial Managed Care - PPO | Admitting: Obstetrics and Gynecology

## 2020-12-12 DIAGNOSIS — N83202 Unspecified ovarian cyst, left side: Secondary | ICD-10-CM

## 2020-12-13 ENCOUNTER — Ambulatory Visit (INDEPENDENT_AMBULATORY_CARE_PROVIDER_SITE_OTHER): Payer: Commercial Managed Care - PPO | Admitting: Obstetrics and Gynecology

## 2020-12-13 ENCOUNTER — Other Ambulatory Visit: Payer: Self-pay

## 2020-12-13 ENCOUNTER — Encounter: Payer: Self-pay | Admitting: Obstetrics and Gynecology

## 2020-12-13 VITALS — BP 128/76 | HR 90 | Resp 18 | Ht 60.0 in | Wt 180.0 lb

## 2020-12-13 DIAGNOSIS — N840 Polyp of corpus uteri: Secondary | ICD-10-CM

## 2020-12-13 DIAGNOSIS — N946 Dysmenorrhea, unspecified: Secondary | ICD-10-CM

## 2020-12-13 DIAGNOSIS — N920 Excessive and frequent menstruation with regular cycle: Secondary | ICD-10-CM

## 2020-12-13 NOTE — Progress Notes (Signed)
Patient ID: Cindy Wang, female   DOB: 16-Sep-1980, 40 y.o.   MRN: [id number]  Reason for Consult: Routine Prenatal Visit   Referred by Gilman Schmidt, Camella Seim R, *  Subjective:     HPI:  Cindy Wang is a 40 y.o. female. She is here today for a preoperative visit. She had a pelvic US performed for heavy and irregular menstrual bleeding. Pelvic US on 10/31/2020 has shown a thickened endometrium and an complex ovarian cyst. The complex ovarian cyst resolved, on repeat US on 11/29/2020.  We have discussed hysteroscopy versus EMB and hysteroscopy was chosen to ensure that sampling and removal of the fundal mass is able to be performed.    Past Medical History:  Diagnosis Date  . BRCA negative 10/2020   MyRisk neg; IBIS=8.5%/riskscore=8.8%  . Ectopic pregnancy   . Family history of ovarian cancer 10/2020  . Headache    migraines  . Hypertension    Family History  Problem Relation Age of Onset  . Ovarian cancer Maternal Aunt    Past Surgical History:  Procedure Laterality Date  . TUBAL LIGATION Bilateral     Short Social History:  Social History   Tobacco Use  . Smoking status: Former Smoker    Packs/day: 0.25    Years: 19.00    Pack years: 4.75    Types: Cigarettes    Quit date: 12/07/2015    Years since quitting: 5.0  . Smokeless tobacco: Never Used  Substance Use Topics  . Alcohol use: No    Alcohol/week: 0.0 standard drinks    No Known Allergies  Current Outpatient Medications  Medication Sig Dispense Refill  . acetaminophen (TYLENOL) 500 MG tablet Take 1,000 mg by mouth every 6 (six) hours as needed for mild pain or headache.    . ibuprofen (ADVIL) 200 MG tablet Take 400-600 mg by mouth every 8 (eight) hours as needed for moderate pain.    Marland Kitchen NIFEdipine (PROCARDIA XL/NIFEDICAL XL) 60 MG 24 hr tablet Take 60 mg by mouth every morning.    . Multiple Vitamins-Calcium (ONE-A-DAY WOMENS PO) Take 1 tablet by mouth daily. With Iron (Patient not taking: Reported on  12/13/2020)     No current facility-administered medications for this visit.    Review of Systems  Constitutional: Negative for chills, fatigue, fever and unexpected weight change.  HENT: Negative for trouble swallowing.  Eyes: Negative for loss of vision.  Respiratory: Negative for cough, shortness of breath and wheezing.  Cardiovascular: Negative for chest pain, leg swelling, palpitations and syncope.  GI: Negative for abdominal pain, blood in stool, diarrhea, nausea and vomiting.  GU: Negative for difficulty urinating, dysuria, frequency and hematuria.  Musculoskeletal: Negative for back pain, leg pain and joint pain.  Skin: Negative for rash.  Neurological: Negative for dizziness, headaches, light-headedness, numbness and seizures.  Psychiatric: Negative for behavioral problem, confusion, depressed mood and sleep disturbance.        Objective:  Objective   Vitals:   12/13/20 1450  BP: 128/76  Pulse: 90  Resp: 18  SpO2: 98%  Weight: 180 lb (81.6 kg)  Height: 5' (1.524 m)   Body mass index is 35.15 kg/m.  Physical Exam Vitals and nursing note reviewed. Exam conducted with a chaperone present.  Constitutional:      Appearance: Normal appearance.  HENT:     Head: Normocephalic and atraumatic.  Eyes:     Extraocular Movements: Extraocular movements intact.     Pupils: Pupils are equal, round, and reactive  to light.  Cardiovascular:     Rate and Rhythm: Normal rate and regular rhythm.  Pulmonary:     Effort: Pulmonary effort is normal.     Breath sounds: Normal breath sounds.  Abdominal:     General: Abdomen is flat.     Palpations: Abdomen is soft.  Musculoskeletal:     Cervical back: Normal range of motion.  Skin:    General: Skin is warm and dry.  Neurological:     General: No focal deficit present.     Mental Status: She is alert and oriented to person, place, and time.  Psychiatric:        Behavior: Behavior normal.        Thought Content: Thought  content normal.        Judgment: Judgment normal.     Assessment/Plan:     40 yo with thickened endometrium and menorrhagia Hysteroscopy  D&C with myosure removal planned for tomorrow.  Discussed risk of bleeding, infection, and damage to surrounding pelvic organ. Discussed the typical postoperative recover and follow up plans for a week after the procedure.  Patient understands and agrees to proceed with the procedure.   More than 20 minutes were spent face to face with the patient in the room, reviewing the medical record, labs and images, and coordinating care for the patient. The plan of management was discussed in detail and counseling was provided.       Adrian Prows MD [facility] 12/13/2020 3:22 PM

## 2020-12-13 NOTE — H&P (View-Only) (Signed)
Patient ID: Cindy Wang, female   DOB: [birth year], 40 y.o.   MRN: [id number]  Reason for Consult: Routine Prenatal Visit   Referred by Gilman Schmidt, Leaf [employer] R, *  Subjective:     HPI:  Cindy Wang is a 40 y.o. female. She is here today for a preoperative visit. She had a pelvic US performed for heavy and irregular menstrual bleeding. Pelvic US on 10/31/2020 has shown a thickened endometrium and an complex ovarian cyst. The complex ovarian cyst resolved, on repeat US on 11/29/2020.  We have discussed hysteroscopy versus EMB and hysteroscopy was chosen to ensure that sampling and removal of the fundal mass is able to be performed.    Past Medical History:  Diagnosis Date  . BRCA negative 10/2020   MyRisk neg; IBIS=8.5%/riskscore=8.8%  . Ectopic pregnancy   . Family history of ovarian cancer 10/2020  . Headache    migraines  . Hypertension    Family History  Problem Relation Age of Onset  . Ovarian cancer Maternal Aunt    Past Surgical History:  Procedure Laterality Date  . TUBAL LIGATION Bilateral     Short Social History:  Social History   Tobacco Use  . Smoking status: Former Smoker    Packs/day: 0.25    Years: 19.00    Pack years: 4.75    Types: Cigarettes    Quit date: 12/07/2015    Years since quitting: 5.0  . Smokeless tobacco: Never Used  Substance Use Topics  . Alcohol use: No    Alcohol/week: 0.0 standard drinks    No Known Allergies  Current Outpatient Medications  Medication Sig Dispense Refill  . acetaminophen (TYLENOL) 500 MG tablet Take 1,000 mg by mouth every 6 (six) hours as needed for mild pain or headache.    . ibuprofen (ADVIL) 200 MG tablet Take 400-600 mg by mouth every 8 (eight) hours as needed for moderate pain.    Marland Kitchen NIFEdipine (PROCARDIA XL/NIFEDICAL XL) 60 MG 24 hr tablet Take 60 mg by mouth every morning.    . Multiple Vitamins-Calcium (ONE-A-DAY WOMENS PO) Take 1 tablet by mouth daily. With Iron (Patient not taking: Reported on  12/13/2020)     No current facility-administered medications for this visit.    Review of Systems  Constitutional: Negative for chills, fatigue, fever and unexpected weight change.  HENT: Negative for trouble swallowing.  Eyes: Negative for loss of vision.  Respiratory: Negative for cough, shortness of breath and wheezing.  Cardiovascular: Negative for chest pain, leg swelling, palpitations and syncope.  GI: Negative for abdominal pain, blood in stool, diarrhea, nausea and vomiting.  GU: Negative for difficulty urinating, dysuria, frequency and hematuria.  Musculoskeletal: Negative for back pain, leg pain and joint pain.  Skin: Negative for rash.  Neurological: Negative for dizziness, headaches, light-headedness, numbness and seizures.  Psychiatric: Negative for behavioral problem, confusion, depressed mood and sleep disturbance.        Objective:  Objective   Vitals:   12/13/20 1450  BP: 128/76  Pulse: 90  Resp: 18  SpO2: 98%  Weight: 180 lb (81.6 kg)  Height: 5' (1.524 m)   Body mass index is 35.15 kg/m.  Physical Exam Vitals and nursing note reviewed. Exam conducted with a chaperone present.  Constitutional:      Appearance: Normal appearance.  HENT:     Head: Normocephalic and atraumatic.  Eyes:     Extraocular Movements: Extraocular movements intact.     Pupils: Pupils are equal, round, and reactive  to light.  Cardiovascular:     Rate and Rhythm: Normal rate and regular rhythm.  Pulmonary:     Effort: Pulmonary effort is normal.     Breath sounds: Normal breath sounds.  Abdominal:     General: Abdomen is flat.     Palpations: Abdomen is soft.  Musculoskeletal:     Cervical back: Normal range of motion.  Skin:    General: Skin is warm and dry.  Neurological:     General: No focal deficit present.     Mental Status: She is alert and oriented to person, place, and time.  Psychiatric:        Behavior: Behavior normal.        Thought Content: Thought  content normal.        Judgment: Judgment normal.     Assessment/Plan:     40 yo with thickened endometrium and menorrhagia Hysteroscopy  D&C with myosure removal planned for tomorrow.  Discussed risk of bleeding, infection, and damage to surrounding pelvic organ. Discussed the typical postoperative recover and follow up plans for a week after the procedure.  Patient understands and agrees to proceed with the procedure.   More than 20 minutes were spent face to face with the patient in the room, reviewing the medical record, labs and images, and coordinating care for the patient. The plan of management was discussed in detail and counseling was provided.       Adrian Prows MD [facility] 12/13/2020 3:22 PM

## 2020-12-14 ENCOUNTER — Ambulatory Visit
Admission: RE | Admit: 2020-12-14 | Discharge: 2020-12-14 | Disposition: A | Discharge status: Home / Self Care | Payer: Commercial Managed Care - PPO | Attending: Obstetrics and Gynecology | Admitting: Obstetrics and Gynecology

## 2020-12-14 ENCOUNTER — Ambulatory Visit: Payer: Commercial Managed Care - PPO | Admitting: Urgent Care

## 2020-12-14 ENCOUNTER — Encounter: Payer: Self-pay | Admitting: Obstetrics and Gynecology

## 2020-12-14 ENCOUNTER — Encounter
Admission: RE | Disposition: A | Discharge status: Home / Self Care | Payer: Self-pay | Source: Home / Self Care | Attending: Obstetrics and Gynecology

## 2020-12-14 DIAGNOSIS — N840 Polyp of corpus uteri: Secondary | ICD-10-CM

## 2020-12-14 DIAGNOSIS — Z79899 Other long term (current) drug therapy: Secondary | ICD-10-CM | POA: Insufficient documentation

## 2020-12-14 DIAGNOSIS — Z87891 Personal history of nicotine dependence: Secondary | ICD-10-CM | POA: Diagnosis not present

## 2020-12-14 DIAGNOSIS — N921 Excessive and frequent menstruation with irregular cycle: Secondary | ICD-10-CM

## 2020-12-14 DIAGNOSIS — R9389 Abnormal findings on diagnostic imaging of other specified body structures: Secondary | ICD-10-CM

## 2020-12-14 DIAGNOSIS — N920 Excessive and frequent menstruation with regular cycle: Secondary | ICD-10-CM | POA: Insufficient documentation

## 2020-12-14 HISTORY — PX: DILATATION & CURETTAGE/HYSTEROSCOPY WITH MYOSURE: SHX6511

## 2020-12-14 LAB — CBC
HCT: 41.5 % (ref 36.0–46.0)
Hemoglobin: 13.2 g/dL (ref 12.0–15.0)
MCH: 26.7 pg (ref 26.0–34.0)
MCHC: 31.8 g/dL (ref 30.0–36.0)
MCV: 84 fL (ref 80.0–100.0)
Platelets: 342 10*3/uL (ref 150–400)
RBC: 4.94 MIL/uL (ref 3.87–5.11)
RDW: 15 % (ref 11.5–15.5)
WBC: 7.6 10*3/uL (ref 4.0–10.5)
nRBC: 0 % (ref 0.0–0.2)

## 2020-12-14 LAB — POCT PREGNANCY, URINE: Preg Test, Ur: NEGATIVE

## 2020-12-14 LAB — TYPE AND SCREEN
ABO/RH(D): O NEG
Antibody Screen: NEGATIVE

## 2020-12-14 SURGERY — DILATATION & CURETTAGE/HYSTEROSCOPY WITH MYOSURE
Anesthesia: General

## 2020-12-14 MED ORDER — LIDOCAINE HCL (CARDIAC) PF 100 MG/5ML IV SOSY
PREFILLED_SYRINGE | INTRAVENOUS | Status: DC | PRN
  Administered 2020-12-14: 80 mg via INTRAVENOUS

## 2020-12-14 MED ORDER — ORAL CARE MOUTH RINSE: 15.0000 mL | Freq: Once | OROMUCOSAL | Status: DC

## 2020-12-14 MED ORDER — MIDAZOLAM HCL 2 MG/2ML IJ SOLN
INTRAMUSCULAR | Status: DC | PRN
  Administered 2020-12-14: 2 mg via INTRAVENOUS

## 2020-12-14 MED ORDER — SUGAMMADEX SODIUM 200 MG/2ML IV SOLN
INTRAVENOUS | Status: DC | PRN
  Administered 2020-12-14: 200 mg via INTRAVENOUS

## 2020-12-14 MED ORDER — IBUPROFEN 600 MG PO TABS: 600.0000 mg | ORAL_TABLET | Freq: Four times a day (QID) | ORAL | 0 refills | Status: AC | PRN

## 2020-12-14 MED ORDER — DEXAMETHASONE SODIUM PHOSPHATE 10 MG/ML IJ SOLN
INTRAMUSCULAR | Status: DC | PRN
  Administered 2020-12-14: 10 mg via INTRAVENOUS

## 2020-12-14 MED ORDER — PROPOFOL 10 MG/ML IV BOLUS
INTRAVENOUS | Status: DC | PRN
  Administered 2020-12-14: 150 mg via INTRAVENOUS

## 2020-12-14 MED ORDER — ACETAMINOPHEN 500 MG PO TABS: 1000.0000 mg | ORAL_TABLET | Freq: Four times a day (QID) | ORAL | 2 refills | Status: AC | PRN

## 2020-12-14 MED ORDER — LACTATED RINGERS IV SOLN: INTRAVENOUS | Status: DC

## 2020-12-14 MED ORDER — FENTANYL CITRATE (PF) 100 MCG/2ML IJ SOLN
INTRAMUSCULAR | Status: AC
  Filled 2020-12-14: qty 2

## 2020-12-14 MED ORDER — CHLORHEXIDINE GLUCONATE 0.12 % MT SOLN
OROMUCOSAL | Status: AC
  Filled 2020-12-14: qty 15

## 2020-12-14 MED ORDER — LIDOCAINE HCL (PF) 2 % IJ SOLN
INTRAMUSCULAR | Status: AC
  Filled 2020-12-14: qty 5

## 2020-12-14 MED ORDER — ONDANSETRON HCL 4 MG/2ML IJ SOLN: 4.0000 mg | Freq: Once | INTRAMUSCULAR | Status: DC | PRN

## 2020-12-14 MED ORDER — POVIDONE-IODINE 10 % EX SWAB: 2.0000 "application " | Freq: Once | CUTANEOUS | Status: DC

## 2020-12-14 MED ORDER — PROPOFOL 10 MG/ML IV BOLUS
INTRAVENOUS | Status: AC
  Filled 2020-12-14: qty 20

## 2020-12-14 MED ORDER — MIDAZOLAM HCL 2 MG/2ML IJ SOLN
INTRAMUSCULAR | Status: AC
  Filled 2020-12-14: qty 2

## 2020-12-14 MED ORDER — CHLORHEXIDINE GLUCONATE 0.12 % MT SOLN: 15.0000 mL | Freq: Once | OROMUCOSAL | Status: DC

## 2020-12-14 MED ORDER — FAMOTIDINE 20 MG PO TABS: 20.0000 mg | ORAL_TABLET | Freq: Once | ORAL | Status: DC

## 2020-12-14 MED ORDER — ONDANSETRON HCL 4 MG/2ML IJ SOLN
INTRAMUSCULAR | Status: DC | PRN
  Administered 2020-12-14: 4 mg via INTRAVENOUS

## 2020-12-14 MED ORDER — LACTATED RINGERS IV SOLN: INTRAVENOUS | Status: DC | PRN

## 2020-12-14 MED ORDER — TRAMADOL HCL 50 MG PO TABS: 50.0000 mg | ORAL_TABLET | Freq: Four times a day (QID) | ORAL | 0 refills | Status: DC | PRN

## 2020-12-14 MED ORDER — FENTANYL CITRATE (PF) 100 MCG/2ML IJ SOLN
25.0000 ug | INTRAMUSCULAR | Status: DC | PRN
  Administered 2020-12-14: 25 ug via INTRAVENOUS

## 2020-12-14 MED ORDER — ROCURONIUM BROMIDE 100 MG/10ML IV SOLN
INTRAVENOUS | Status: DC | PRN
  Administered 2020-12-14: 40 mg via INTRAVENOUS

## 2020-12-14 MED ORDER — FENTANYL CITRATE (PF) 100 MCG/2ML IJ SOLN
INTRAMUSCULAR | Status: DC | PRN
  Administered 2020-12-14 (×2): 50 ug via INTRAVENOUS

## 2020-12-14 SURGICAL SUPPLY — 18 items
CATH ROBINSON RED A/P 16FR (CATHETERS) ×2 IMPLANT
DEVICE MYOSURE LITE (MISCELLANEOUS) IMPLANT
DEVICE MYOSURE REACH (MISCELLANEOUS) ×2 IMPLANT
ELECT REM PT RETURN 9FT ADLT (ELECTROSURGICAL)
ELECTRODE REM PT RTRN 9FT ADLT (ELECTROSURGICAL) IMPLANT
GAUZE 4X4 16PLY RFD (DISPOSABLE) ×2 IMPLANT
GLOVE SURG SYN 6.5 ES PF (GLOVE) ×2 IMPLANT
GLOVE SURG UNDER POLY LF SZ6.5 (GLOVE) ×4 IMPLANT
GOWN STRL REUS W/ TWL LRG LVL3 (GOWN DISPOSABLE) ×2 IMPLANT
GOWN STRL REUS W/TWL LRG LVL3 (GOWN DISPOSABLE) ×4
IV NS IRRIG 3000ML ARTHROMATIC (IV SOLUTION) ×2 IMPLANT
KIT PROCEDURE FLUENT (KITS) IMPLANT
MANIFOLD NEPTUNE II (INSTRUMENTS) ×2 IMPLANT
PACK DNC HYST (MISCELLANEOUS) ×2 IMPLANT
PAD OB MATERNITY 4.3X12.25 (PERSONAL CARE ITEMS) ×2 IMPLANT
PAD PREP 24X41 OB/GYN DISP (PERSONAL CARE ITEMS) ×2 IMPLANT
SEAL ROD LENS SCOPE MYOSURE (ABLATOR) ×2 IMPLANT
TOWEL OR 17X26 4PK STRL BLUE (TOWEL DISPOSABLE) ×2 IMPLANT

## 2020-12-14 NOTE — Interval H&P Note (Signed)
History and Physical Interval Note:  12/14/2020 10:37 AM  Cindy Wang  has presented today for surgery, with the diagnosis of endometrial polyp.  The various methods of treatment have been discussed with the patient and family. After consideration of risks, benefits and other options for treatment, the patient has consented to  Procedure(s): DILATATION & CURETTAGE/HYSTEROSCOPY WITH MYOSURE (N/A) as a surgical intervention.  The patient's history has been reviewed, patient examined, no change in status, stable for surgery.  I have reviewed the patient's chart and labs.  Questions were answered to the patient's satisfaction.     Lexianna Weinrich R Miguelangel Korn

## 2020-12-14 NOTE — Anesthesia Preprocedure Evaluation (Addendum)
Anesthesia Evaluation  Patient identified by MRN, date of birth, ID band Patient awake    Reviewed: Allergy & Precautions, NPO status , Patient's Chart, lab work & pertinent test results  Airway Mallampati: II  TM Distance: >3 FB     Dental  (+) Chipped   Pulmonary former smoker,    Pulmonary exam normal        Cardiovascular hypertension, Normal cardiovascular exam     Neuro/Psych  Headaches, negative psych ROS   GI/Hepatic negative GI ROS, Neg liver ROS,   Endo/Other  negative endocrine ROS  Renal/GU negative Renal ROS  negative genitourinary   Musculoskeletal negative musculoskeletal ROS (+)   Abdominal Normal abdominal exam  (+)   Peds negative pediatric ROS (+)  Hematology negative hematology ROS (+)   Anesthesia Other Findings Past Medical History: 10/2020: BRCA negative     Comment:  MyRisk neg; IBIS=8.5%/riskscore=8.8% No date: Ectopic pregnancy 10/2020: Family history of ovarian cancer No date: Headache     Comment:  migraines No date: Hypertension  Reproductive/Obstetrics                            Anesthesia Physical Anesthesia Plan  ASA: II  Anesthesia Plan: General   Post-op Pain Management:    Induction: Intravenous  PONV Risk Score and Plan:   Airway Management Planned: Oral ETT  Additional Equipment:   Intra-op Plan:   Post-operative Plan: Extubation in OR  Informed Consent: I have reviewed the patients History and Physical, chart, labs and discussed the procedure including the risks, benefits and alternatives for the proposed anesthesia with the patient or authorized representative who has indicated his/her understanding and acceptance.       Plan Discussed with: CRNA and Surgeon  Anesthesia Plan Comments:        Anesthesia Quick Evaluation

## 2020-12-14 NOTE — Anesthesia Procedure Notes (Signed)
Procedure Name: Intubation Date/Time: 12/14/2020 11:28 AM Performed by: Allean Found, CRNA Pre-anesthesia Checklist: Patient identified, Patient being monitored, Timeout performed, Emergency Drugs available and Suction available Patient Re-evaluated:Patient Re-evaluated prior to induction Oxygen Delivery Method: Circle system utilized Preoxygenation: Pre-oxygenation with 100% oxygen Induction Type: IV induction Ventilation: Mask ventilation without difficulty Laryngoscope Size: Mac and 3 Grade View: Grade I Tube type: Oral Tube size: 7.0 mm Number of attempts: 1 Airway Equipment and Method: Stylet Placement Confirmation: ETT inserted through vocal cords under direct vision,  positive ETCO2 and breath sounds checked- equal and bilateral Secured at: 21 cm Tube secured with: Tape Dental Injury: Teeth and Oropharynx as per pre-operative assessment

## 2020-12-14 NOTE — Transfer of Care (Signed)
Immediate Anesthesia Transfer of Care Note  Patient: Cindy Wang  Procedure(s) Performed: DILATATION & CURETTAGE/HYSTEROSCOPY WITH MYOSURE (N/A )  Patient Location: PACU  Anesthesia Type:General  Level of Consciousness: awake and alert   Airway & Oxygen Therapy: Patient Spontanous Breathing  Post-op Assessment: Report given to RN and Post -op Vital signs reviewed and stable  Post vital signs: Reviewed and stable  Last Vitals:  Vitals Value Taken Time  BP 133/103 12/14/20 1211  Temp    Pulse 79 12/14/20 1213  Resp 0 12/14/20 1213  SpO2 91 % 12/14/20 1213  Vitals shown include unvalidated device data.  Last Pain:  Vitals:   12/14/20 1001  TempSrc: Temporal  PainSc: 5          Complications: No complications documented.

## 2020-12-14 NOTE — Op Note (Signed)
Operative Note  12/14/2020  PRE-OP DIAGNOSIS: Thickened Endometrium, menorrhagia  POST-OP DIAGNOSIS: Thickened Endometrium, menorrhagia endometrial polyp  SURGEON: Coley Kulikowski MD  PROCEDURE: Procedure(s): DILATATION & CURETTAGE/HYSTEROSCOPY WITH MYOSURE   ANESTHESIA: Choice   ESTIMATED BLOOD LOSS: 10 cc   SPECIMENS:  Endometrial polyp and curettings  FLUID DEFICIT: 160 cc  COMPLICATIONS: non  DISPOSITION: PACU - hemodynamically stable.  CONDITION: stable  FINDINGS: Exam under anesthesia revealed  9 cm uterus with bilateral adnexa withou masses or fullness. Hysteroscopy revealed a fundal polypin the uterine cavity with bilateral tubal ostia and normal appearing endocervical canal.  PROCEDURE IN DETAIL: After informed consent was obtained, the patient was taken to the operating room where anesthesia was obtained without difficulty. The patient was positioned in the dorsal lithotomy position in [place] stirrups. The patient's bladder was catheterized with an in and out foley catheter. The patient was examined under anesthesia, with the above noted findings. The weightedspeculum was placed inside the patient's vagina, and the the anterior lip of the cervix was seen and grasped with the tenaculum.  The uterine cavity was sounded to 9cm, and then the cervix was progressively dilated to a 16 French-Pratt dilator. The 0 degree hysteroscope was introduced, with saline fluid used to distend the intrauterine cavity, with the above noted findings.  The Myosure was used to remove the uterine polyp. Once the cavity was sampled entirely the hysteroscope was removed.   The uterine cavity was curetted until a gritty texture was noted, yielding endometrial curettings. Excellent hemostasis was noted, and all instruments were removed, with excellent hemostasis noted throughout. She was then taken out of dorsal lithotomy. Minimal discrepancy in fluid was noted.  The patient tolerated the procedure  well. Sponge, lap and needle counts were correct x2. The patient was taken to recovery room in excellent condition.  Adelene Idler MD [facility], [facility] 12/14/2020 12:23 PM

## 2020-12-14 NOTE — Discharge Instructions (Signed)
Dilation and Curettage or Vacuum Curettage, Care After This sheet gives you information about how to care for yourself after your procedure. Your doctor may also give you more specific instructions. If you have problems or questions, contact your doctor. What can I expect after the procedure? After the procedure, it is common to have:  Mild pain or cramping.  Some bleeding or spotting from the vagina. These may last for up to 2 weeks. Follow these instructions at home: Medicines  Take over-the-counter and prescription medicines only as told by your doctor. This is very important if you take blood-thinning medicine.  Ask your doctor if the medicine prescribed to you requires you to avoid driving or using machinery. Activity  If you were given a medicine to help you relax (sedative) during your procedure, it can affect you for many hours. Do not drive or use machinery until your doctor says that it is safe.  Rest as told by your doctor.  Do not sit for a long time without moving. Get up to take short walks every 1-2 hours. This is important. Ask for help if you feel weak or unsteady.  Do not lift anything that is heavier than 10 lb (4.5 kg), or the limit that you are told, until your doctor says that it is safe.  Return to your normal activities as told by your doctor. Ask your doctor what activities are safe for you.   Lifestyle For at least 2 weeks, or as long as told by your doctor:  Do not douche.  Do not use tampons.  Do not have sex. General instructions  Wear compression stockings as told by your doctor.  It is up to you to get the results of your procedure. Ask your doctor, or the department that is doing the procedure, when your results will be ready.  Keep all follow-up visits as told by your doctor. This is important. Contact a doctor if:  You have very bad cramps that get worse or do not get better with medicine.  You have very bad pain in your belly  (abdomen).  You cannot drink fluids without vomiting.  You have pain in a different part of your pelvis. The pelvis is the area just above your thighs.  You have fluid from your vagina that smells bad.  You have a rash. Get help right away if:  You are bleeding a lot from your vagina. A lot of bleeding means soaking more than one sanitary pad in 1 hour for 2 hours in a row.  You have a fever that is above 100.4F (38.0C).  Your belly feels very tender or hard.  You have chest pain.  You have trouble breathing.  You feel dizzy.  You feel light-headed.  You pass out (faint).  You have pain in your neck or shoulder area. These symptoms may be an emergency. Do not wait to see if the symptoms will go away. Get medical help right away. Call your local emergency services (911 in the [place] Do not drive yourself to the hospital. Summary  After your procedure, it is common to have pain or cramping. It is also common to have bleeding or spotting from your vagina.  Rest as told. Do not sit for a long time without moving. Get up to take short walks every 1-2 hours.  Do not lift anything that is heavier than 10 lb (4.5 kg), or the limit that you are told.  Contact your doctor if you have fluid from   your vagina that smells bad.  Get help right away if you develop any problems from the procedure. Ask your doctor what problems to watch for. This information is not intended to replace advice given to you by your health care provider. Make sure you discuss any questions you have with your health care provider. Document Revised: 09/14/2019 Document Reviewed: 09/14/2019 Elsevier Patient Education  2021 [employer]   AMBULATORY SURGERY  DISCHARGE INSTRUCTIONS   1) The drugs that you were given will stay in your system until tomorrow so for the next 24 hours you should not:  A) Drive an automobile B) Make any legal decisions C) Drink any alcoholic beverage   2) You may resume  regular meals tomorrow.  Today it is better to start with liquids and gradually work up to solid foods.  You may eat anything you prefer, but it is better to start with liquids, then soup and crackers, and gradually work up to solid foods.   3) Please notify your doctor immediately if you have any unusual bleeding, trouble breathing, redness and pain at the surgery site, drainage, fever, or pain not relieved by medication.    4) Additional Instructions:    Please contact your physician with any problems or Same Day Surgery at [phone], Monday through Friday 6 am to 4 pm, or [place] [facility] [place] Main number at [phone]. 

## 2020-12-15 ENCOUNTER — Encounter: Payer: Self-pay | Admitting: Obstetrics and Gynecology

## 2020-12-15 LAB — SURGICAL PATHOLOGY

## 2020-12-16 NOTE — Anesthesia Postprocedure Evaluation (Signed)
Anesthesia Post Note  Patient: Cindy Wang  Procedure(s) Performed: DILATATION & CURETTAGE/HYSTEROSCOPY WITH MYOSURE (N/A )  Patient location during evaluation: PACU Anesthesia Type: General Level of consciousness: awake and alert and oriented Pain management: pain level controlled Vital Signs Assessment: post-procedure vital signs reviewed and stable Respiratory status: spontaneous breathing Cardiovascular status: blood pressure returned to baseline Anesthetic complications: no   No complications documented.   Last Vitals:  Vitals:   12/14/20 1245 12/14/20 1316  BP: 118/89 (!) 129/93  Pulse: (!) 59 76  Resp: 10 17  Temp: (!) 36.3 C (!) 36.3 C  SpO2: 99% 99%    Last Pain:  Vitals:   12/14/20 1316  TempSrc: Temporal  PainSc: 0-No pain                 Deara Bober

## 2020-12-19 ENCOUNTER — Other Ambulatory Visit: Payer: Self-pay | Admitting: Obstetrics and Gynecology

## 2020-12-19 DIAGNOSIS — Z8041 Family history of malignant neoplasm of ovary: Secondary | ICD-10-CM

## 2020-12-19 NOTE — Progress Notes (Signed)
MyRisk order from 3/22 by Dr. Jerene Pitch

## 2020-12-22 ENCOUNTER — Ambulatory Visit (INDEPENDENT_AMBULATORY_CARE_PROVIDER_SITE_OTHER): Payer: Commercial Managed Care - PPO | Admitting: Obstetrics and Gynecology

## 2020-12-22 ENCOUNTER — Encounter: Payer: Self-pay | Admitting: Obstetrics and Gynecology

## 2020-12-22 ENCOUNTER — Other Ambulatory Visit: Payer: Self-pay

## 2020-12-22 VITALS — BP 120/80 | HR 84 | Ht 60.0 in | Wt 179.0 lb

## 2020-12-22 DIAGNOSIS — N840 Polyp of corpus uteri: Secondary | ICD-10-CM

## 2020-12-22 DIAGNOSIS — Z4889 Encounter for other specified surgical aftercare: Secondary | ICD-10-CM

## 2020-12-22 DIAGNOSIS — Z9889 Other specified postprocedural states: Secondary | ICD-10-CM

## 2020-12-22 NOTE — Progress Notes (Signed)
Constipation/Diarrhea/Feels full all the time.

## 2020-12-22 NOTE — Progress Notes (Signed)
  Postoperative Follow-up Patient presents post op from  Hysteroscopy with Myosure Polypectomy  for  Endometrial polyp , 1 week ago.  Subjective: Patient reports some improvement in her preop symptoms. Eating a regular diet without difficulty. Pain is controlled with current analgesics. Medications being used: acetaminophen and ibuprofen (OTC).  Activity: normal activities of daily living. Patient reports additional symptom's since surgery of alternating constipation and diarrhea.  Objective: BP 120/80 (Cuff Size: Normal)   Pulse 84   Ht 5' (1.524 m)   Wt 179 lb (81.2 kg)   LMP 12/06/2020 Comment: CURRENTLY ON PERIOD  BMI 34.96 kg/m  Physical Exam Constitutional:      Appearance: Normal appearance. She is well-developed.  HENT:     Head: Normocephalic and atraumatic.  Eyes:     Extraocular Movements: Extraocular movements intact.     Pupils: Pupils are equal, round, and reactive to light.  Neck:     Thyroid: No thyromegaly.  Cardiovascular:     Rate and Rhythm: Normal rate and regular rhythm.     Heart sounds: Normal heart sounds.  Pulmonary:     Effort: Pulmonary effort is normal.     Breath sounds: Normal breath sounds.  Abdominal:     General: Bowel sounds are normal. There is no distension.     Palpations: Abdomen is soft. There is no mass.  Musculoskeletal:     Cervical back: Neck supple.  Neurological:     Mental Status: She is alert and oriented to person, place, and time.  Skin:    General: Skin is warm and dry.  Psychiatric:        Behavior: Behavior normal.        Thought Content: Thought content normal.        Judgment: Judgment normal.  Vitals reviewed.    Assessment: s/p : Hysteroscopy with Myosure Polypectomy  - stable  Plan: Patient has done well after surgery with no apparent complications.  I have discussed the post-operative course to date, and the expected progress moving forward.  The patient understands what complications to be concerned about.   I will see the patient in routine follow up, or sooner if needed.   Given copy of 1 of 2 Myrisk report which was negative.  Activity plan: No restriction.  Adelene Idler MD, [facility], [facility] 12/22/2020 12:43 PM

## 2020-12-31 ENCOUNTER — Other Ambulatory Visit: Payer: Self-pay | Admitting: Obstetrics and Gynecology

## 2022-08-27 ENCOUNTER — Encounter: Payer: Self-pay | Admitting: Emergency Medicine

## 2022-08-27 ENCOUNTER — Emergency Department: Payer: Commercial Managed Care - PPO

## 2022-08-27 ENCOUNTER — Other Ambulatory Visit: Payer: Self-pay

## 2022-08-27 ENCOUNTER — Emergency Department
Admission: EM | Admit: 2022-08-27 | Discharge: 2022-08-27 | Disposition: A | Discharge status: Home / Self Care | Payer: Commercial Managed Care - PPO | Attending: Emergency Medicine | Admitting: Emergency Medicine

## 2022-08-27 DIAGNOSIS — J101 Influenza due to other identified influenza virus with other respiratory manifestations: Secondary | ICD-10-CM | POA: Insufficient documentation

## 2022-08-27 DIAGNOSIS — I10 Essential (primary) hypertension: Secondary | ICD-10-CM | POA: Insufficient documentation

## 2022-08-27 DIAGNOSIS — Z1152 Encounter for screening for COVID-19: Secondary | ICD-10-CM | POA: Diagnosis not present

## 2022-08-27 DIAGNOSIS — J111 Influenza due to unidentified influenza virus with other respiratory manifestations: Secondary | ICD-10-CM

## 2022-08-27 DIAGNOSIS — R059 Cough, unspecified: Secondary | ICD-10-CM | POA: Diagnosis present

## 2022-08-27 LAB — RESP PANEL BY RT-PCR (RSV, FLU A&B, COVID)  RVPGX2
Influenza A by PCR: NEGATIVE
Influenza B by PCR: POSITIVE — AB
Resp Syncytial Virus by PCR: NEGATIVE
SARS Coronavirus 2 by RT PCR: NEGATIVE

## 2022-08-27 MED ORDER — BENZONATATE 100 MG PO CAPS: 100.0000 mg | ORAL_CAPSULE | Freq: Three times a day (TID) | ORAL | 0 refills | Status: AC | PRN

## 2022-08-27 MED ORDER — AMOXICILLIN 500 MG PO CAPS: 1000.0000 mg | ORAL_CAPSULE | Freq: Three times a day (TID) | ORAL | 0 refills | Status: AC

## 2022-08-27 NOTE — Discharge Instructions (Addendum)
-  You tested positive for influenza.  Your chest x-ray does not show any signs of pneumonia, however given the length and duration of your symptoms, I will provide you with a short course of amoxicillin to take if your symptoms fail to improve over the next few days.  You may additionally take the benzonatate as needed for cough.  -Follow-up with your primary care provider as needed.  -Return to the emergency department anytime if you begin to experience any new or worsening symptoms.

## 2022-08-27 NOTE — ED Provider Notes (Signed)
[facility] Provider Note    Event Date/Time   First MD Initiated Contact with Patient 08/27/22 [phone]     (approximate)   History   Chief Complaint Cough and Fever   HPI Cindy Wang is a 42 y.o. female, history of hypertension, presents to the emergency department for evaluation of cough and fever x 2 weeks.  She states that she believes she caught a cold a few weeks ago.  She states that she was beginning to feel better, however now is starting to feel worse over the past 5 to 7 days.  She states that her daughter is also experiencing similar symptoms as well.  Denies chest pain, abdominal pain, flank pain, nausea/vomiting, diarrhea, urinary symptoms, rash/lesions, or dizziness/lightheadedness.  History Limitations: No limitations.        Physical Exam  Triage Vital Signs: ED Triage Vitals  Enc Vitals Group     BP 08/27/22 0805 (!) 151/109     Pulse Rate 08/27/22 0805 100     Resp 08/27/22 0805 18     Temp 08/27/22 0805 99.3 F (37.4 C)     Temp Source 08/27/22 0805 Oral     SpO2 08/27/22 0805 99 %     Weight 08/27/22 0806 170 lb (77.1 kg)     Height 08/27/22 0806 5\' 1"  (1.549 m)     Head Circumference --      Peak Flow --      Pain Score 08/27/22 0806 10     Pain Loc --      Pain Edu? --      Excl. in GC? --     Most recent vital signs: Vitals:   08/27/22 0805  BP: (!) 151/109  Pulse: 100  Resp: 18  Temp: 99.3 F (37.4 C)  SpO2: 99%    General: Awake, NAD.  Audible cough and congestion in the room. Skin: Warm, dry. No rashes or lesions.  Eyes: PERRL. Conjunctivae normal.  CV: Good peripheral perfusion.  Resp: Normal effort.  Lung sounds clear bilaterally.  No rhonchi, rales, or wheezing. Abd: Soft, non-tender. No distention.  Neuro: At baseline. No gross neurological deficits.  Musculoskeletal: Normal ROM of all extremities.  Physical Exam    ED Results / Procedures / Treatments  Labs (all labs ordered are listed,  but only abnormal results are displayed) Labs Reviewed  RESP PANEL BY RT-PCR (RSV, FLU A&B, COVID)  [id number] - Abnormal; Notable for the following components:      Result Value   Influenza B by PCR POSITIVE (*)    All other components within normal limits     EKG N/A.    RADIOLOGY  ED Provider Interpretation: I personally reviewed and interpreted this x-ray, no evidence of pneumonia.  DG Chest 2 View  Result Date: 08/27/2022 CLINICAL DATA:  42 year old female presents for about dilation of cough and congestion. EXAM: CHEST - 2 VIEW COMPARISON:  None Available. FINDINGS: The heart size and mediastinal contours are within normal limits. Both lungs are clear. The visualized skeletal structures are unremarkable. IMPRESSION: No active cardiopulmonary disease. Electronically Signed   By: Donzetta Kohut M.D.   On: 08/27/2022 09:07    PROCEDURES:  Critical Care performed: N/A.  Procedures    MEDICATIONS ORDERED IN ED: Medications - No data to display   IMPRESSION / MDM / ASSESSMENT AND PLAN / ED COURSE  I reviewed the triage vital signs and the nursing notes.  Differential diagnosis includes, but is not limited to, COVID-19, RSV, influenza, community-acquired pneumonia, viral URI, bronchitis.   Assessment/Plan Presentation consistent with influenza, confirmed by PCR.  She appears clinically well at this time.  Lung sounds clear bilaterally in the apices and bases.  Chest x-ray does not show any signs of pneumonia, however given the double worsening of her symptoms over the past couple weeks, we will provide her with a short course of amoxicillin.  Additionally provide her with benzonatate to help manage her cough.  Recommend that she follow with her primary care provider as needed.  She was amenable to this plan.  Will discharge.  Provided the patient with anticipatory guidance, return precautions, and educational material. Encouraged the patient to  return to the emergency department at any time if they begin to experience any new or worsening symptoms. Patient expressed understanding and agreed with the plan.   Patient's presentation is most consistent with acute complicated illness / injury requiring diagnostic workup.       FINAL CLINICAL IMPRESSION(S) / ED DIAGNOSES   Final diagnoses:  Influenza     Rx / DC Orders   ED Discharge Orders          Ordered    amoxicillin (AMOXIL) 500 MG capsule  3 times daily        08/27/22 0919    benzonatate (TESSALON PERLES) 100 MG capsule  3 times daily PRN        08/27/22 0919             Note:  This document was prepared using Dragon voice recognition software and may include unintentional dictation errors.   Varney Daily, [place] 08/27/22 1450    Chesley Noon, MD 08/28/22 1504

## 2022-08-27 NOTE — ED Triage Notes (Signed)
Pt reports productive cough, fever x 2 weeks. Pain to ribs from coughing so much.
